# Patient Record
Sex: Female | Born: 1965 | Race: Black or African American | Hispanic: No | Marital: Married | Smoking: Current every day smoker
Health system: Southern US, Community
[De-identification: ages and names within clinical notes are randomized; demographics above are authoritative.]

## PROBLEM LIST (undated history)

## (undated) DIAGNOSIS — I10 Essential (primary) hypertension: Secondary | ICD-10-CM

## (undated) DIAGNOSIS — Z720 Tobacco use: Secondary | ICD-10-CM

## (undated) DIAGNOSIS — I5020 Unspecified systolic (congestive) heart failure: Secondary | ICD-10-CM

## (undated) DIAGNOSIS — I2510 Atherosclerotic heart disease of native coronary artery without angina pectoris: Secondary | ICD-10-CM

## (undated) DIAGNOSIS — R7303 Prediabetes: Secondary | ICD-10-CM

## (undated) HISTORY — DX: Tobacco use: Z72.0

## (undated) HISTORY — DX: Unspecified systolic (congestive) heart failure: I50.20

## (undated) HISTORY — DX: Prediabetes: R73.03

## (undated) HISTORY — PX: ABDOMINAL HYSTERECTOMY: SHX81

## (undated) HISTORY — DX: Essential (primary) hypertension: I10

## (undated) HISTORY — DX: Atherosclerotic heart disease of native coronary artery without angina pectoris: I25.10

---

## 1999-10-02 ENCOUNTER — Emergency Department (HOSPITAL_COMMUNITY): Admission: EM | Admit: 1999-10-02 | Discharge: 1999-10-02 | Payer: Self-pay | Admitting: Emergency Medicine

## 2008-06-04 ENCOUNTER — Ambulatory Visit: Payer: Self-pay | Admitting: Family Medicine

## 2009-09-02 ENCOUNTER — Ambulatory Visit: Payer: Self-pay | Admitting: Internal Medicine

## 2009-10-28 ENCOUNTER — Ambulatory Visit: Payer: Self-pay | Admitting: Internal Medicine

## 2010-06-10 ENCOUNTER — Ambulatory Visit: Payer: Self-pay | Admitting: Internal Medicine

## 2011-02-18 ENCOUNTER — Ambulatory Visit: Payer: Self-pay | Admitting: Family Medicine

## 2012-03-13 ENCOUNTER — Ambulatory Visit: Payer: Self-pay

## 2012-05-08 ENCOUNTER — Emergency Department: Payer: Self-pay | Admitting: Emergency Medicine

## 2013-02-02 ENCOUNTER — Ambulatory Visit: Payer: Self-pay
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
## 2015-02-16 ENCOUNTER — Ambulatory Visit: Payer: Self-pay
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
## 2018-07-09 DIAGNOSIS — M79603 Pain in arm, unspecified: Secondary | ICD-10-CM | POA: Diagnosis not present

## 2018-07-09 DIAGNOSIS — R079 Chest pain, unspecified: Secondary | ICD-10-CM | POA: Diagnosis not present

## 2019-06-08 DIAGNOSIS — U071 COVID-19: Secondary | ICD-10-CM | POA: Diagnosis not present

## 2019-06-09 DIAGNOSIS — U071 COVID-19: Secondary | ICD-10-CM | POA: Diagnosis not present

## 2019-06-18 DIAGNOSIS — U071 COVID-19: Secondary | ICD-10-CM | POA: Diagnosis not present

## 2019-07-01 DIAGNOSIS — U071 COVID-19: Secondary | ICD-10-CM | POA: Diagnosis not present

## 2019-07-10 DIAGNOSIS — U071 COVID-19: Secondary | ICD-10-CM | POA: Diagnosis not present

## 2019-07-17 DIAGNOSIS — U071 COVID-19: Secondary | ICD-10-CM | POA: Diagnosis not present

## 2019-07-23 DIAGNOSIS — U071 COVID-19: Secondary | ICD-10-CM | POA: Diagnosis not present

## 2019-07-30 DIAGNOSIS — U071 COVID-19: Secondary | ICD-10-CM | POA: Diagnosis not present

## 2019-08-06 DIAGNOSIS — U071 COVID-19: Secondary | ICD-10-CM | POA: Diagnosis not present

## 2019-08-13 DIAGNOSIS — U071 COVID-19: Secondary | ICD-10-CM | POA: Diagnosis not present

## 2019-08-27 DIAGNOSIS — U071 COVID-19: Secondary | ICD-10-CM | POA: Diagnosis not present

## 2019-09-03 DIAGNOSIS — U071 COVID-19: Secondary | ICD-10-CM | POA: Diagnosis not present

## 2019-09-11 DIAGNOSIS — U071 COVID-19: Secondary | ICD-10-CM | POA: Diagnosis not present

## 2019-09-17 DIAGNOSIS — U071 COVID-19: Secondary | ICD-10-CM | POA: Diagnosis not present

## 2019-09-22 DIAGNOSIS — Z6823 Body mass index (BMI) 23.0-23.9, adult: Secondary | ICD-10-CM | POA: Diagnosis not present

## 2019-09-22 DIAGNOSIS — I10 Essential (primary) hypertension: Secondary | ICD-10-CM | POA: Diagnosis not present

## 2019-09-22 DIAGNOSIS — Z7689 Persons encountering health services in other specified circumstances: Secondary | ICD-10-CM | POA: Diagnosis not present

## 2019-09-22 DIAGNOSIS — Z23 Encounter for immunization: Secondary | ICD-10-CM | POA: Diagnosis not present

## 2019-09-22 DIAGNOSIS — Z1331 Encounter for screening for depression: Secondary | ICD-10-CM | POA: Diagnosis not present

## 2019-09-22 DIAGNOSIS — E041 Nontoxic single thyroid nodule: Secondary | ICD-10-CM | POA: Diagnosis not present

## 2019-09-24 DIAGNOSIS — U071 COVID-19: Secondary | ICD-10-CM | POA: Diagnosis not present

## 2019-10-01 DIAGNOSIS — U071 COVID-19: Secondary | ICD-10-CM | POA: Diagnosis not present

## 2019-10-06 DIAGNOSIS — I10 Essential (primary) hypertension: Secondary | ICD-10-CM | POA: Diagnosis not present

## 2019-10-06 DIAGNOSIS — Z6823 Body mass index (BMI) 23.0-23.9, adult: Secondary | ICD-10-CM | POA: Diagnosis not present

## 2019-10-06 DIAGNOSIS — R7309 Other abnormal glucose: Secondary | ICD-10-CM | POA: Diagnosis not present

## 2019-10-06 DIAGNOSIS — L309 Dermatitis, unspecified: Secondary | ICD-10-CM | POA: Diagnosis not present

## 2019-10-07 DIAGNOSIS — U071 COVID-19: Secondary | ICD-10-CM | POA: Diagnosis not present

## 2019-10-14 DIAGNOSIS — U071 COVID-19: Secondary | ICD-10-CM | POA: Diagnosis not present

## 2019-10-21 DIAGNOSIS — U071 COVID-19: Secondary | ICD-10-CM | POA: Diagnosis not present

## 2020-02-22 DIAGNOSIS — U071 COVID-19: Secondary | ICD-10-CM | POA: Diagnosis not present

## 2020-02-25 DIAGNOSIS — U071 COVID-19: Secondary | ICD-10-CM | POA: Diagnosis not present

## 2020-02-25 DIAGNOSIS — Z20828 Contact with and (suspected) exposure to other viral communicable diseases: Secondary | ICD-10-CM | POA: Diagnosis not present

## 2020-02-29 DIAGNOSIS — U071 COVID-19: Secondary | ICD-10-CM | POA: Diagnosis not present

## 2020-02-29 DIAGNOSIS — Z20828 Contact with and (suspected) exposure to other viral communicable diseases: Secondary | ICD-10-CM | POA: Diagnosis not present

## 2020-03-03 DIAGNOSIS — Z20828 Contact with and (suspected) exposure to other viral communicable diseases: Secondary | ICD-10-CM | POA: Diagnosis not present

## 2020-03-03 DIAGNOSIS — U071 COVID-19: Secondary | ICD-10-CM | POA: Diagnosis not present

## 2020-03-07 DIAGNOSIS — Z20828 Contact with and (suspected) exposure to other viral communicable diseases: Secondary | ICD-10-CM | POA: Diagnosis not present

## 2020-03-07 DIAGNOSIS — U071 COVID-19: Secondary | ICD-10-CM | POA: Diagnosis not present

## 2020-03-10 DIAGNOSIS — Z20828 Contact with and (suspected) exposure to other viral communicable diseases: Secondary | ICD-10-CM | POA: Diagnosis not present

## 2020-03-10 DIAGNOSIS — U071 COVID-19: Secondary | ICD-10-CM | POA: Diagnosis not present

## 2020-03-14 DIAGNOSIS — U071 COVID-19: Secondary | ICD-10-CM | POA: Diagnosis not present

## 2020-03-14 DIAGNOSIS — Z20828 Contact with and (suspected) exposure to other viral communicable diseases: Secondary | ICD-10-CM | POA: Diagnosis not present

## 2020-03-17 DIAGNOSIS — U071 COVID-19: Secondary | ICD-10-CM | POA: Diagnosis not present

## 2020-03-21 DIAGNOSIS — Z20828 Contact with and (suspected) exposure to other viral communicable diseases: Secondary | ICD-10-CM | POA: Diagnosis not present

## 2020-03-21 DIAGNOSIS — U071 COVID-19: Secondary | ICD-10-CM | POA: Diagnosis not present

## 2020-03-24 DIAGNOSIS — U071 COVID-19: Secondary | ICD-10-CM | POA: Diagnosis not present

## 2020-03-24 DIAGNOSIS — Z20828 Contact with and (suspected) exposure to other viral communicable diseases: Secondary | ICD-10-CM | POA: Diagnosis not present

## 2020-03-28 DIAGNOSIS — Z20828 Contact with and (suspected) exposure to other viral communicable diseases: Secondary | ICD-10-CM | POA: Diagnosis not present

## 2020-03-28 DIAGNOSIS — U071 COVID-19: Secondary | ICD-10-CM | POA: Diagnosis not present

## 2020-03-31 DIAGNOSIS — U071 COVID-19: Secondary | ICD-10-CM | POA: Diagnosis not present

## 2020-03-31 DIAGNOSIS — Z20828 Contact with and (suspected) exposure to other viral communicable diseases: Secondary | ICD-10-CM | POA: Diagnosis not present

## 2020-04-04 DIAGNOSIS — Z20828 Contact with and (suspected) exposure to other viral communicable diseases: Secondary | ICD-10-CM | POA: Diagnosis not present

## 2020-04-04 DIAGNOSIS — U071 COVID-19: Secondary | ICD-10-CM | POA: Diagnosis not present

## 2020-04-07 DIAGNOSIS — U071 COVID-19: Secondary | ICD-10-CM | POA: Diagnosis not present

## 2020-04-07 DIAGNOSIS — Z20828 Contact with and (suspected) exposure to other viral communicable diseases: Secondary | ICD-10-CM | POA: Diagnosis not present

## 2020-04-11 DIAGNOSIS — Z20828 Contact with and (suspected) exposure to other viral communicable diseases: Secondary | ICD-10-CM | POA: Diagnosis not present

## 2020-04-11 DIAGNOSIS — U071 COVID-19: Secondary | ICD-10-CM | POA: Diagnosis not present

## 2020-04-18 DIAGNOSIS — U071 COVID-19: Secondary | ICD-10-CM | POA: Diagnosis not present

## 2020-04-18 DIAGNOSIS — Z20828 Contact with and (suspected) exposure to other viral communicable diseases: Secondary | ICD-10-CM | POA: Diagnosis not present

## 2020-04-21 DIAGNOSIS — U071 COVID-19: Secondary | ICD-10-CM | POA: Diagnosis not present

## 2020-04-21 DIAGNOSIS — Z20828 Contact with and (suspected) exposure to other viral communicable diseases: Secondary | ICD-10-CM | POA: Diagnosis not present

## 2020-04-25 DIAGNOSIS — F17210 Nicotine dependence, cigarettes, uncomplicated: Secondary | ICD-10-CM | POA: Diagnosis not present

## 2020-04-25 DIAGNOSIS — R4701 Aphasia: Secondary | ICD-10-CM | POA: Diagnosis not present

## 2020-04-25 DIAGNOSIS — E782 Mixed hyperlipidemia: Secondary | ICD-10-CM | POA: Diagnosis not present

## 2020-04-25 DIAGNOSIS — G459 Transient cerebral ischemic attack, unspecified: Secondary | ICD-10-CM | POA: Diagnosis not present

## 2020-04-25 DIAGNOSIS — R471 Dysarthria and anarthria: Secondary | ICD-10-CM | POA: Diagnosis not present

## 2020-04-25 DIAGNOSIS — R4781 Slurred speech: Secondary | ICD-10-CM | POA: Diagnosis not present

## 2020-04-25 DIAGNOSIS — R531 Weakness: Secondary | ICD-10-CM | POA: Diagnosis not present

## 2020-04-25 DIAGNOSIS — G4489 Other headache syndrome: Secondary | ICD-10-CM | POA: Diagnosis not present

## 2020-04-25 DIAGNOSIS — R7309 Other abnormal glucose: Secondary | ICD-10-CM | POA: Diagnosis not present

## 2020-04-28 DIAGNOSIS — F411 Generalized anxiety disorder: Secondary | ICD-10-CM | POA: Diagnosis not present

## 2020-04-28 DIAGNOSIS — Z20828 Contact with and (suspected) exposure to other viral communicable diseases: Secondary | ICD-10-CM | POA: Diagnosis not present

## 2020-04-28 DIAGNOSIS — I10 Essential (primary) hypertension: Secondary | ICD-10-CM | POA: Diagnosis not present

## 2020-04-28 DIAGNOSIS — U071 COVID-19: Secondary | ICD-10-CM | POA: Diagnosis not present

## 2020-05-02 DIAGNOSIS — U071 COVID-19: Secondary | ICD-10-CM | POA: Diagnosis not present

## 2020-05-03 DIAGNOSIS — U071 COVID-19: Secondary | ICD-10-CM | POA: Diagnosis not present

## 2020-05-03 DIAGNOSIS — Z20828 Contact with and (suspected) exposure to other viral communicable diseases: Secondary | ICD-10-CM | POA: Diagnosis not present

## 2020-05-05 DIAGNOSIS — Z20828 Contact with and (suspected) exposure to other viral communicable diseases: Secondary | ICD-10-CM | POA: Diagnosis not present

## 2020-05-05 DIAGNOSIS — U071 COVID-19: Secondary | ICD-10-CM | POA: Diagnosis not present

## 2020-05-06 DIAGNOSIS — L239 Allergic contact dermatitis, unspecified cause: Secondary | ICD-10-CM | POA: Diagnosis not present

## 2020-05-06 DIAGNOSIS — F411 Generalized anxiety disorder: Secondary | ICD-10-CM | POA: Diagnosis not present

## 2020-05-09 DIAGNOSIS — U071 COVID-19: Secondary | ICD-10-CM | POA: Diagnosis not present

## 2020-05-10 DIAGNOSIS — U071 COVID-19: Secondary | ICD-10-CM | POA: Diagnosis not present

## 2020-05-10 DIAGNOSIS — Z20828 Contact with and (suspected) exposure to other viral communicable diseases: Secondary | ICD-10-CM | POA: Diagnosis not present

## 2020-05-24 DIAGNOSIS — Z20828 Contact with and (suspected) exposure to other viral communicable diseases: Secondary | ICD-10-CM | POA: Diagnosis not present

## 2020-05-24 DIAGNOSIS — U071 COVID-19: Secondary | ICD-10-CM | POA: Diagnosis not present

## 2020-06-01 DIAGNOSIS — U071 COVID-19: Secondary | ICD-10-CM | POA: Diagnosis not present

## 2020-06-01 DIAGNOSIS — Z20828 Contact with and (suspected) exposure to other viral communicable diseases: Secondary | ICD-10-CM | POA: Diagnosis not present

## 2020-06-06 DIAGNOSIS — U071 COVID-19: Secondary | ICD-10-CM | POA: Diagnosis not present

## 2020-06-06 DIAGNOSIS — Z20828 Contact with and (suspected) exposure to other viral communicable diseases: Secondary | ICD-10-CM | POA: Diagnosis not present

## 2020-06-13 DIAGNOSIS — Z20828 Contact with and (suspected) exposure to other viral communicable diseases: Secondary | ICD-10-CM | POA: Diagnosis not present

## 2020-06-13 DIAGNOSIS — U071 COVID-19: Secondary | ICD-10-CM | POA: Diagnosis not present

## 2020-06-22 DIAGNOSIS — U071 COVID-19: Secondary | ICD-10-CM | POA: Diagnosis not present

## 2020-06-29 DIAGNOSIS — Z20828 Contact with and (suspected) exposure to other viral communicable diseases: Secondary | ICD-10-CM | POA: Diagnosis not present

## 2020-06-29 DIAGNOSIS — U071 COVID-19: Secondary | ICD-10-CM | POA: Diagnosis not present

## 2020-07-04 DIAGNOSIS — U071 COVID-19: Secondary | ICD-10-CM | POA: Diagnosis not present

## 2020-07-04 DIAGNOSIS — Z20828 Contact with and (suspected) exposure to other viral communicable diseases: Secondary | ICD-10-CM | POA: Diagnosis not present

## 2020-07-27 DIAGNOSIS — U071 COVID-19: Secondary | ICD-10-CM | POA: Diagnosis not present

## 2020-07-27 DIAGNOSIS — Z20828 Contact with and (suspected) exposure to other viral communicable diseases: Secondary | ICD-10-CM | POA: Diagnosis not present

## 2020-08-02 DIAGNOSIS — U071 COVID-19: Secondary | ICD-10-CM | POA: Diagnosis not present

## 2020-08-02 DIAGNOSIS — Z20828 Contact with and (suspected) exposure to other viral communicable diseases: Secondary | ICD-10-CM | POA: Diagnosis not present

## 2020-08-08 DIAGNOSIS — U071 COVID-19: Secondary | ICD-10-CM | POA: Diagnosis not present

## 2020-08-08 DIAGNOSIS — Z20828 Contact with and (suspected) exposure to other viral communicable diseases: Secondary | ICD-10-CM | POA: Diagnosis not present

## 2020-08-16 DIAGNOSIS — U071 COVID-19: Secondary | ICD-10-CM | POA: Diagnosis not present

## 2020-08-16 DIAGNOSIS — Z20828 Contact with and (suspected) exposure to other viral communicable diseases: Secondary | ICD-10-CM | POA: Diagnosis not present

## 2020-08-22 DIAGNOSIS — U071 COVID-19: Secondary | ICD-10-CM | POA: Diagnosis not present

## 2020-08-22 DIAGNOSIS — Z20828 Contact with and (suspected) exposure to other viral communicable diseases: Secondary | ICD-10-CM | POA: Diagnosis not present

## 2020-08-30 DIAGNOSIS — Z20828 Contact with and (suspected) exposure to other viral communicable diseases: Secondary | ICD-10-CM | POA: Diagnosis not present

## 2020-08-30 DIAGNOSIS — U071 COVID-19: Secondary | ICD-10-CM | POA: Diagnosis not present

## 2020-09-05 DIAGNOSIS — Z20828 Contact with and (suspected) exposure to other viral communicable diseases: Secondary | ICD-10-CM | POA: Diagnosis not present

## 2020-09-05 DIAGNOSIS — U071 COVID-19: Secondary | ICD-10-CM | POA: Diagnosis not present

## 2020-09-08 DIAGNOSIS — U071 COVID-19: Secondary | ICD-10-CM | POA: Diagnosis not present

## 2020-09-08 DIAGNOSIS — Z20828 Contact with and (suspected) exposure to other viral communicable diseases: Secondary | ICD-10-CM | POA: Diagnosis not present

## 2020-09-13 DIAGNOSIS — Z20828 Contact with and (suspected) exposure to other viral communicable diseases: Secondary | ICD-10-CM | POA: Diagnosis not present

## 2020-09-13 DIAGNOSIS — U071 COVID-19: Secondary | ICD-10-CM | POA: Diagnosis not present

## 2020-09-15 DIAGNOSIS — U071 COVID-19: Secondary | ICD-10-CM | POA: Diagnosis not present

## 2020-09-15 DIAGNOSIS — Z20828 Contact with and (suspected) exposure to other viral communicable diseases: Secondary | ICD-10-CM | POA: Diagnosis not present

## 2020-09-20 DIAGNOSIS — Z20828 Contact with and (suspected) exposure to other viral communicable diseases: Secondary | ICD-10-CM | POA: Diagnosis not present

## 2020-09-20 DIAGNOSIS — U071 COVID-19: Secondary | ICD-10-CM | POA: Diagnosis not present

## 2020-09-22 DIAGNOSIS — Z20828 Contact with and (suspected) exposure to other viral communicable diseases: Secondary | ICD-10-CM | POA: Diagnosis not present

## 2020-09-22 DIAGNOSIS — U071 COVID-19: Secondary | ICD-10-CM | POA: Diagnosis not present

## 2020-09-27 DIAGNOSIS — Z20828 Contact with and (suspected) exposure to other viral communicable diseases: Secondary | ICD-10-CM | POA: Diagnosis not present

## 2020-09-27 DIAGNOSIS — U071 COVID-19: Secondary | ICD-10-CM | POA: Diagnosis not present

## 2020-09-29 DIAGNOSIS — U071 COVID-19: Secondary | ICD-10-CM | POA: Diagnosis not present

## 2020-09-29 DIAGNOSIS — Z20828 Contact with and (suspected) exposure to other viral communicable diseases: Secondary | ICD-10-CM | POA: Diagnosis not present

## 2020-10-06 DIAGNOSIS — U071 COVID-19: Secondary | ICD-10-CM | POA: Diagnosis not present

## 2020-10-12 DIAGNOSIS — U071 COVID-19: Secondary | ICD-10-CM | POA: Diagnosis not present

## 2020-10-12 DIAGNOSIS — Z20828 Contact with and (suspected) exposure to other viral communicable diseases: Secondary | ICD-10-CM | POA: Diagnosis not present

## 2020-10-13 DIAGNOSIS — U071 COVID-19: Secondary | ICD-10-CM | POA: Diagnosis not present

## 2020-10-13 DIAGNOSIS — Z20828 Contact with and (suspected) exposure to other viral communicable diseases: Secondary | ICD-10-CM | POA: Diagnosis not present

## 2020-10-20 DIAGNOSIS — U071 COVID-19: Secondary | ICD-10-CM | POA: Diagnosis not present

## 2020-10-20 DIAGNOSIS — Z20828 Contact with and (suspected) exposure to other viral communicable diseases: Secondary | ICD-10-CM | POA: Diagnosis not present

## 2020-10-24 DIAGNOSIS — U071 COVID-19: Secondary | ICD-10-CM | POA: Diagnosis not present

## 2020-10-24 DIAGNOSIS — Z20828 Contact with and (suspected) exposure to other viral communicable diseases: Secondary | ICD-10-CM | POA: Diagnosis not present

## 2020-10-27 DIAGNOSIS — U071 COVID-19: Secondary | ICD-10-CM | POA: Diagnosis not present

## 2020-10-27 DIAGNOSIS — Z20828 Contact with and (suspected) exposure to other viral communicable diseases: Secondary | ICD-10-CM | POA: Diagnosis not present

## 2020-10-31 DIAGNOSIS — Z20828 Contact with and (suspected) exposure to other viral communicable diseases: Secondary | ICD-10-CM | POA: Diagnosis not present

## 2020-10-31 DIAGNOSIS — U071 COVID-19: Secondary | ICD-10-CM | POA: Diagnosis not present

## 2020-11-03 DIAGNOSIS — Z20828 Contact with and (suspected) exposure to other viral communicable diseases: Secondary | ICD-10-CM | POA: Diagnosis not present

## 2020-11-03 DIAGNOSIS — U071 COVID-19: Secondary | ICD-10-CM | POA: Diagnosis not present

## 2020-11-07 DIAGNOSIS — U071 COVID-19: Secondary | ICD-10-CM | POA: Diagnosis not present

## 2020-11-07 DIAGNOSIS — Z20828 Contact with and (suspected) exposure to other viral communicable diseases: Secondary | ICD-10-CM | POA: Diagnosis not present

## 2020-11-10 DIAGNOSIS — Z20828 Contact with and (suspected) exposure to other viral communicable diseases: Secondary | ICD-10-CM | POA: Diagnosis not present

## 2020-11-10 DIAGNOSIS — U071 COVID-19: Secondary | ICD-10-CM | POA: Diagnosis not present

## 2020-11-14 DIAGNOSIS — U071 COVID-19: Secondary | ICD-10-CM | POA: Diagnosis not present

## 2020-11-14 DIAGNOSIS — Z20828 Contact with and (suspected) exposure to other viral communicable diseases: Secondary | ICD-10-CM | POA: Diagnosis not present

## 2020-11-21 DIAGNOSIS — U071 COVID-19: Secondary | ICD-10-CM | POA: Diagnosis not present

## 2020-11-21 DIAGNOSIS — Z20828 Contact with and (suspected) exposure to other viral communicable diseases: Secondary | ICD-10-CM | POA: Diagnosis not present

## 2020-11-24 DIAGNOSIS — Z20828 Contact with and (suspected) exposure to other viral communicable diseases: Secondary | ICD-10-CM | POA: Diagnosis not present

## 2020-11-24 DIAGNOSIS — U071 COVID-19: Secondary | ICD-10-CM | POA: Diagnosis not present

## 2020-12-01 DIAGNOSIS — Z20828 Contact with and (suspected) exposure to other viral communicable diseases: Secondary | ICD-10-CM | POA: Diagnosis not present

## 2020-12-01 DIAGNOSIS — U071 COVID-19: Secondary | ICD-10-CM | POA: Diagnosis not present

## 2020-12-05 DIAGNOSIS — Z20828 Contact with and (suspected) exposure to other viral communicable diseases: Secondary | ICD-10-CM | POA: Diagnosis not present

## 2020-12-05 DIAGNOSIS — U071 COVID-19: Secondary | ICD-10-CM | POA: Diagnosis not present

## 2020-12-08 DIAGNOSIS — Z20828 Contact with and (suspected) exposure to other viral communicable diseases: Secondary | ICD-10-CM | POA: Diagnosis not present

## 2020-12-08 DIAGNOSIS — U071 COVID-19: Secondary | ICD-10-CM | POA: Diagnosis not present

## 2020-12-12 DIAGNOSIS — Z20828 Contact with and (suspected) exposure to other viral communicable diseases: Secondary | ICD-10-CM | POA: Diagnosis not present

## 2020-12-12 DIAGNOSIS — U071 COVID-19: Secondary | ICD-10-CM | POA: Diagnosis not present

## 2020-12-13 DIAGNOSIS — Z20828 Contact with and (suspected) exposure to other viral communicable diseases: Secondary | ICD-10-CM | POA: Diagnosis not present

## 2020-12-13 DIAGNOSIS — U071 COVID-19: Secondary | ICD-10-CM | POA: Diagnosis not present

## 2020-12-20 DIAGNOSIS — U071 COVID-19: Secondary | ICD-10-CM | POA: Diagnosis not present

## 2020-12-20 DIAGNOSIS — Z20828 Contact with and (suspected) exposure to other viral communicable diseases: Secondary | ICD-10-CM | POA: Diagnosis not present

## 2020-12-22 DIAGNOSIS — Z20828 Contact with and (suspected) exposure to other viral communicable diseases: Secondary | ICD-10-CM | POA: Diagnosis not present

## 2020-12-22 DIAGNOSIS — U071 COVID-19: Secondary | ICD-10-CM | POA: Diagnosis not present

## 2020-12-26 DIAGNOSIS — U071 COVID-19: Secondary | ICD-10-CM | POA: Diagnosis not present

## 2020-12-27 DIAGNOSIS — Z20828 Contact with and (suspected) exposure to other viral communicable diseases: Secondary | ICD-10-CM | POA: Diagnosis not present

## 2020-12-27 DIAGNOSIS — U071 COVID-19: Secondary | ICD-10-CM | POA: Diagnosis not present

## 2020-12-29 DIAGNOSIS — U071 COVID-19: Secondary | ICD-10-CM | POA: Diagnosis not present

## 2020-12-29 DIAGNOSIS — Z20828 Contact with and (suspected) exposure to other viral communicable diseases: Secondary | ICD-10-CM | POA: Diagnosis not present

## 2021-01-02 DIAGNOSIS — U071 COVID-19: Secondary | ICD-10-CM | POA: Diagnosis not present

## 2021-01-02 DIAGNOSIS — Z20828 Contact with and (suspected) exposure to other viral communicable diseases: Secondary | ICD-10-CM | POA: Diagnosis not present

## 2021-01-05 DIAGNOSIS — U071 COVID-19: Secondary | ICD-10-CM | POA: Diagnosis not present

## 2021-01-05 DIAGNOSIS — Z20828 Contact with and (suspected) exposure to other viral communicable diseases: Secondary | ICD-10-CM | POA: Diagnosis not present

## 2021-01-10 DIAGNOSIS — U071 COVID-19: Secondary | ICD-10-CM | POA: Diagnosis not present

## 2021-01-12 DIAGNOSIS — Z20828 Contact with and (suspected) exposure to other viral communicable diseases: Secondary | ICD-10-CM | POA: Diagnosis not present

## 2021-01-12 DIAGNOSIS — U071 COVID-19: Secondary | ICD-10-CM | POA: Diagnosis not present

## 2021-01-16 DIAGNOSIS — U071 COVID-19: Secondary | ICD-10-CM | POA: Diagnosis not present

## 2021-01-16 DIAGNOSIS — Z20828 Contact with and (suspected) exposure to other viral communicable diseases: Secondary | ICD-10-CM | POA: Diagnosis not present

## 2021-01-19 DIAGNOSIS — Z20828 Contact with and (suspected) exposure to other viral communicable diseases: Secondary | ICD-10-CM | POA: Diagnosis not present

## 2021-01-19 DIAGNOSIS — U071 COVID-19: Secondary | ICD-10-CM | POA: Diagnosis not present

## 2021-01-23 DIAGNOSIS — Z20828 Contact with and (suspected) exposure to other viral communicable diseases: Secondary | ICD-10-CM | POA: Diagnosis not present

## 2021-01-23 DIAGNOSIS — U071 COVID-19: Secondary | ICD-10-CM | POA: Diagnosis not present

## 2021-01-26 DIAGNOSIS — U071 COVID-19: Secondary | ICD-10-CM | POA: Diagnosis not present

## 2021-01-30 DIAGNOSIS — U071 COVID-19: Secondary | ICD-10-CM | POA: Diagnosis not present

## 2021-02-02 DIAGNOSIS — U071 COVID-19: Secondary | ICD-10-CM | POA: Diagnosis not present

## 2021-02-06 DIAGNOSIS — Z20828 Contact with and (suspected) exposure to other viral communicable diseases: Secondary | ICD-10-CM | POA: Diagnosis not present

## 2021-02-06 DIAGNOSIS — U071 COVID-19: Secondary | ICD-10-CM | POA: Diagnosis not present

## 2021-02-07 DIAGNOSIS — U071 COVID-19: Secondary | ICD-10-CM | POA: Diagnosis not present

## 2021-02-07 DIAGNOSIS — Z20828 Contact with and (suspected) exposure to other viral communicable diseases: Secondary | ICD-10-CM | POA: Diagnosis not present

## 2021-02-09 DIAGNOSIS — Z20828 Contact with and (suspected) exposure to other viral communicable diseases: Secondary | ICD-10-CM | POA: Diagnosis not present

## 2021-02-09 DIAGNOSIS — U071 COVID-19: Secondary | ICD-10-CM | POA: Diagnosis not present

## 2021-02-13 DIAGNOSIS — U071 COVID-19: Secondary | ICD-10-CM | POA: Diagnosis not present

## 2021-02-13 DIAGNOSIS — Z20828 Contact with and (suspected) exposure to other viral communicable diseases: Secondary | ICD-10-CM | POA: Diagnosis not present

## 2021-02-16 DIAGNOSIS — U071 COVID-19: Secondary | ICD-10-CM | POA: Diagnosis not present

## 2021-02-16 DIAGNOSIS — Z20828 Contact with and (suspected) exposure to other viral communicable diseases: Secondary | ICD-10-CM | POA: Diagnosis not present

## 2021-02-20 DIAGNOSIS — U071 COVID-19: Secondary | ICD-10-CM | POA: Diagnosis not present

## 2021-02-20 DIAGNOSIS — Z20828 Contact with and (suspected) exposure to other viral communicable diseases: Secondary | ICD-10-CM | POA: Diagnosis not present

## 2021-02-23 DIAGNOSIS — Z20828 Contact with and (suspected) exposure to other viral communicable diseases: Secondary | ICD-10-CM | POA: Diagnosis not present

## 2021-02-23 DIAGNOSIS — U071 COVID-19: Secondary | ICD-10-CM | POA: Diagnosis not present

## 2021-02-27 DIAGNOSIS — Z20828 Contact with and (suspected) exposure to other viral communicable diseases: Secondary | ICD-10-CM | POA: Diagnosis not present

## 2021-02-27 DIAGNOSIS — U071 COVID-19: Secondary | ICD-10-CM | POA: Diagnosis not present

## 2021-03-02 DIAGNOSIS — Z20828 Contact with and (suspected) exposure to other viral communicable diseases: Secondary | ICD-10-CM | POA: Diagnosis not present

## 2021-03-02 DIAGNOSIS — U071 COVID-19: Secondary | ICD-10-CM | POA: Diagnosis not present

## 2021-03-09 DIAGNOSIS — Z20828 Contact with and (suspected) exposure to other viral communicable diseases: Secondary | ICD-10-CM | POA: Diagnosis not present

## 2021-03-09 DIAGNOSIS — U071 COVID-19: Secondary | ICD-10-CM | POA: Diagnosis not present

## 2021-03-13 DIAGNOSIS — Z20828 Contact with and (suspected) exposure to other viral communicable diseases: Secondary | ICD-10-CM | POA: Diagnosis not present

## 2021-03-13 DIAGNOSIS — U071 COVID-19: Secondary | ICD-10-CM | POA: Diagnosis not present

## 2021-04-17 DIAGNOSIS — R7309 Other abnormal glucose: Secondary | ICD-10-CM | POA: Diagnosis not present

## 2021-04-17 DIAGNOSIS — L309 Dermatitis, unspecified: Secondary | ICD-10-CM | POA: Diagnosis not present

## 2021-04-17 DIAGNOSIS — E782 Mixed hyperlipidemia: Secondary | ICD-10-CM | POA: Diagnosis not present

## 2021-04-17 DIAGNOSIS — I10 Essential (primary) hypertension: Secondary | ICD-10-CM | POA: Diagnosis not present

## 2021-05-07 DIAGNOSIS — S161XXA Strain of muscle, fascia and tendon at neck level, initial encounter: Secondary | ICD-10-CM | POA: Diagnosis not present

## 2021-05-07 DIAGNOSIS — M25512 Pain in left shoulder: Secondary | ICD-10-CM | POA: Diagnosis not present

## 2021-05-30 DIAGNOSIS — Z6822 Body mass index (BMI) 22.0-22.9, adult: Secondary | ICD-10-CM | POA: Diagnosis not present

## 2021-05-30 DIAGNOSIS — R252 Cramp and spasm: Secondary | ICD-10-CM | POA: Diagnosis not present

## 2021-08-25 DIAGNOSIS — J069 Acute upper respiratory infection, unspecified: Secondary | ICD-10-CM | POA: Diagnosis not present

## 2023-07-05 ENCOUNTER — Inpatient Hospital Stay (HOSPITAL_COMMUNITY)
Admission: RE | Admit: 2023-07-05 | Discharge: 2023-07-13 | DRG: 233 | Disposition: A | Discharge status: Home / Self Care | Payer: Managed Care, Other (non HMO) | Attending: Thoracic Surgery (Cardiothoracic Vascular Surgery) | Admitting: Thoracic Surgery (Cardiothoracic Vascular Surgery)

## 2023-07-05 ENCOUNTER — Encounter (HOSPITAL_COMMUNITY)
Admission: RE | Disposition: A | Discharge status: Home / Self Care | Payer: Self-pay | Source: Home / Self Care | Attending: Thoracic Surgery (Cardiothoracic Vascular Surgery)

## 2023-07-05 DIAGNOSIS — E8729 Other acidosis: Secondary | ICD-10-CM | POA: Diagnosis not present

## 2023-07-05 DIAGNOSIS — M79602 Pain in left arm: Secondary | ICD-10-CM

## 2023-07-05 DIAGNOSIS — Z1152 Encounter for screening for COVID-19: Secondary | ICD-10-CM | POA: Diagnosis not present

## 2023-07-05 DIAGNOSIS — I10 Essential (primary) hypertension: Secondary | ICD-10-CM

## 2023-07-05 DIAGNOSIS — I2510 Atherosclerotic heart disease of native coronary artery without angina pectoris: Secondary | ICD-10-CM | POA: Diagnosis present

## 2023-07-05 DIAGNOSIS — I249 Acute ischemic heart disease, unspecified: Secondary | ICD-10-CM

## 2023-07-05 DIAGNOSIS — F17210 Nicotine dependence, cigarettes, uncomplicated: Secondary | ICD-10-CM | POA: Diagnosis present

## 2023-07-05 DIAGNOSIS — I214 Non-ST elevation (NSTEMI) myocardial infarction: Secondary | ICD-10-CM

## 2023-07-05 DIAGNOSIS — I517 Cardiomegaly: Secondary | ICD-10-CM

## 2023-07-05 DIAGNOSIS — Z951 Presence of aortocoronary bypass graft: Secondary | ICD-10-CM

## 2023-07-05 DIAGNOSIS — I110 Hypertensive heart disease with heart failure: Secondary | ICD-10-CM | POA: Diagnosis present

## 2023-07-05 DIAGNOSIS — F40240 Claustrophobia: Secondary | ICD-10-CM | POA: Diagnosis present

## 2023-07-05 DIAGNOSIS — Z720 Tobacco use: Secondary | ICD-10-CM

## 2023-07-05 DIAGNOSIS — D696 Thrombocytopenia, unspecified: Secondary | ICD-10-CM | POA: Diagnosis not present

## 2023-07-05 DIAGNOSIS — I5021 Acute systolic (congestive) heart failure: Secondary | ICD-10-CM | POA: Diagnosis present

## 2023-07-05 DIAGNOSIS — E785 Hyperlipidemia, unspecified: Secondary | ICD-10-CM | POA: Diagnosis present

## 2023-07-05 DIAGNOSIS — Z01810 Encounter for preprocedural cardiovascular examination: Secondary | ICD-10-CM | POA: Diagnosis not present

## 2023-07-05 DIAGNOSIS — R7303 Prediabetes: Secondary | ICD-10-CM | POA: Diagnosis present

## 2023-07-05 DIAGNOSIS — I361 Nonrheumatic tricuspid (valve) insufficiency: Secondary | ICD-10-CM

## 2023-07-05 DIAGNOSIS — I25110 Atherosclerotic heart disease of native coronary artery with unstable angina pectoris: Secondary | ICD-10-CM | POA: Diagnosis not present

## 2023-07-05 DIAGNOSIS — R9431 Abnormal electrocardiogram [ECG] [EKG]: Secondary | ICD-10-CM

## 2023-07-05 DIAGNOSIS — I252 Old myocardial infarction: Secondary | ICD-10-CM | POA: Diagnosis not present

## 2023-07-05 HISTORY — DX: Non-ST elevation (NSTEMI) myocardial infarction: I21.4

## 2023-07-05 HISTORY — PX: LEFT HEART CATH AND CORONARY ANGIOGRAPHY: CATH118249

## 2023-07-05 SURGERY — LEFT HEART CATH AND CORONARY ANGIOGRAPHY
Anesthesia: LOCAL

## 2023-07-05 MED ORDER — HEPARIN SODIUM (PORCINE) 1000 UNIT/ML IJ SOLN
INTRAMUSCULAR | Status: DC | PRN
  Administered 2023-07-05: 3000 [IU] via INTRAVENOUS

## 2023-07-05 MED ORDER — LIDOCAINE HCL (PF) 1 % IJ SOLN
INTRAMUSCULAR | Status: AC
  Filled 2023-07-05: qty 30

## 2023-07-05 MED ORDER — VERAPAMIL HCL 2.5 MG/ML IV SOLN
INTRAVENOUS | Status: DC | PRN
  Administered 2023-07-05: 10 mL via INTRA_ARTERIAL

## 2023-07-05 MED ORDER — HEPARIN (PORCINE) IN NACL 1000-0.9 UT/500ML-% IV SOLN
INTRAVENOUS | Status: DC | PRN
  Administered 2023-07-05 (×2): 500 mL

## 2023-07-05 MED ORDER — FENTANYL CITRATE (PF) 100 MCG/2ML IJ SOLN
INTRAMUSCULAR | Status: DC | PRN
  Administered 2023-07-05: 25 ug via INTRAVENOUS

## 2023-07-05 MED ORDER — MIDAZOLAM HCL 2 MG/2ML IJ SOLN
INTRAMUSCULAR | Status: AC
  Filled 2023-07-05: qty 2

## 2023-07-05 MED ORDER — CARVEDILOL 3.125 MG PO TABS: 3.1250 mg | ORAL_TABLET | Freq: Two times a day (BID) | ORAL | Status: DC

## 2023-07-05 MED ORDER — HEPARIN SODIUM (PORCINE) 1000 UNIT/ML IJ SOLN
INTRAMUSCULAR | Status: AC
  Filled 2023-07-05: qty 10

## 2023-07-05 MED ORDER — ACETAMINOPHEN 325 MG PO TABS
650.0000 mg | ORAL_TABLET | ORAL | Status: DC | PRN
  Administered 2023-07-05 – 2023-07-07 (×4): 650 mg via ORAL
  Filled 2023-07-05 (×4): qty 2

## 2023-07-05 MED ORDER — ISOSORBIDE MONONITRATE ER 30 MG PO TB24
30.0000 mg | ORAL_TABLET | Freq: Every day | ORAL | Status: DC
  Administered 2023-07-05 – 2023-07-08 (×4): 30 mg via ORAL
  Filled 2023-07-05 (×4): qty 1

## 2023-07-05 MED ORDER — CARVEDILOL 6.25 MG PO TABS
6.2500 mg | ORAL_TABLET | Freq: Two times a day (BID) | ORAL | Status: DC
  Administered 2023-07-05 – 2023-07-08 (×7): 6.25 mg via ORAL
  Filled 2023-07-05 (×7): qty 1

## 2023-07-05 MED ORDER — FENTANYL CITRATE (PF) 100 MCG/2ML IJ SOLN
INTRAMUSCULAR | Status: AC
  Filled 2023-07-05: qty 2

## 2023-07-05 MED ORDER — ASPIRIN 81 MG PO TBEC
81.0000 mg | DELAYED_RELEASE_TABLET | Freq: Every day | ORAL | Status: DC
  Administered 2023-07-06 – 2023-07-08 (×3): 81 mg via ORAL
  Filled 2023-07-05 (×3): qty 1

## 2023-07-05 MED ORDER — SODIUM CHLORIDE 0.9% FLUSH: 3.0000 mL | INTRAVENOUS | Status: DC | PRN

## 2023-07-05 MED ORDER — LIDOCAINE HCL (PF) 1 % IJ SOLN
INTRAMUSCULAR | Status: DC | PRN
  Administered 2023-07-05: 2 mL

## 2023-07-05 MED ORDER — SODIUM CHLORIDE 0.9 % IV SOLN: 250.0000 mL | INTRAVENOUS | Status: DC | PRN

## 2023-07-05 MED ORDER — HEPARIN (PORCINE) 25000 UT/250ML-% IV SOLN
1050.0000 [IU]/h | INTRAVENOUS | Status: DC
  Administered 2023-07-05: 750 [IU]/h via INTRAVENOUS
  Administered 2023-07-07 – 2023-07-09 (×2): 1050 [IU]/h via INTRAVENOUS
  Filled 2023-07-05 (×4): qty 250

## 2023-07-05 MED ORDER — IOHEXOL 350 MG/ML SOLN
INTRAVENOUS | Status: DC | PRN
  Administered 2023-07-05: 50 mL

## 2023-07-05 MED ORDER — ONDANSETRON HCL 4 MG/2ML IJ SOLN: 4.0000 mg | Freq: Four times a day (QID) | INTRAMUSCULAR | Status: DC | PRN

## 2023-07-05 MED ORDER — VERAPAMIL HCL 2.5 MG/ML IV SOLN
INTRAVENOUS | Status: AC
  Filled 2023-07-05: qty 2

## 2023-07-05 MED ORDER — ROSUVASTATIN CALCIUM 20 MG PO TABS
20.0000 mg | ORAL_TABLET | Freq: Every day | ORAL | Status: DC
  Administered 2023-07-05 – 2023-07-08 (×4): 20 mg via ORAL
  Filled 2023-07-05 (×5): qty 1

## 2023-07-05 MED ORDER — NITROGLYCERIN 0.4 MG SL SUBL: 0.4000 mg | SUBLINGUAL_TABLET | SUBLINGUAL | Status: DC | PRN

## 2023-07-05 MED ORDER — HYDRALAZINE HCL 20 MG/ML IJ SOLN: 10.0000 mg | INTRAMUSCULAR | Status: AC | PRN

## 2023-07-05 MED ORDER — SODIUM CHLORIDE 0.9 % WEIGHT BASED INFUSION
1.0000 mL/kg/h | INTRAVENOUS | Status: AC
  Administered 2023-07-05: 1 mL/kg/h via INTRAVENOUS

## 2023-07-05 MED ORDER — SODIUM CHLORIDE 0.9% FLUSH
3.0000 mL | Freq: Two times a day (BID) | INTRAVENOUS | Status: DC
  Administered 2023-07-06: 3 mL via INTRAVENOUS

## 2023-07-05 MED ORDER — MIDAZOLAM HCL 2 MG/2ML IJ SOLN
INTRAMUSCULAR | Status: DC | PRN
  Administered 2023-07-05: 1 mg via INTRAVENOUS

## 2023-07-05 SURGICAL SUPPLY — 11 items
CATH 5FR JL3.5 JR4 ANG PIG MP (CATHETERS) IMPLANT
DEVICE RAD COMP TR BAND LRG (VASCULAR PRODUCTS) IMPLANT
GLIDESHEATH SLEND SS 6F .021 (SHEATH) IMPLANT
GUIDEWIRE INQWIRE 1.5J.035X260 (WIRE) IMPLANT
INQWIRE 1.5J .035X260CM (WIRE) ×1
KIT HEART LEFT (KITS) ×1 IMPLANT
PACK CARDIAC CATHETERIZATION (CUSTOM PROCEDURE TRAY) ×1 IMPLANT
SHEATH PROBE COVER 6X72 (BAG) IMPLANT
TRANSDUCER W/STOPCOCK (MISCELLANEOUS) ×1 IMPLANT
TUBING CIL FLEX 10 FLL-RA (TUBING) ×1 IMPLANT
WIRE HI TORQ VERSACORE-J 145CM (WIRE) IMPLANT

## 2023-07-05 NOTE — Progress Notes (Signed)
Patient arrived with fluids at 80 and heparin gtt at 7. Denies chest pain. Hemodynamically stable. Awaiting team for cath lab pickup.

## 2023-07-05 NOTE — H&P (Signed)
Cardiology Admission History and Physical   Patient ID: Aaisha Backs MRN: [id number]; DOB: [birth year]   Admission date: 07/05/2023  PCP:  No primary care provider on file.   [facility] Providers Cardiologist:  None        Chief Complaint:  chest pain  Patient Profile:   Joanne Garner is a 57 y.o. female with history of hypertension (off meds x2 years), hyperlipidemia, tobacco use, anxiety who is being seen 07/05/2023 for the evaluation of chest pain.   History of Present Illness:   Ms. Knauff presented to [facility] ED on 07/05/23 reporting symptoms of nonspecific chest heaviness with pain in bilateral arms/shoulders. Per patient, she actually didn't go to the ED because of these symptoms, but rather because she had a bump on the right side of her face that she was concerned "she needed blood work for." While there, she was noted to be hypertensive with BP up to 204/96. Because of this BP and question of anginal like symptoms, cardiac workup started. 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 While doing this, she felt a little more tired than usual and had sensation of her arms being heavy/weak. Symptoms improved with rest but she did have some recurrence of this today. In the ED at [facility], patient found with troponin 0.45->1.2 and decision made to admit to the hospital for further evaluation with suspicion of NSTEMI. Heparin initiated. Following cardiology consult at [facility], decision made to transfer patient to [facility] for [facility]. Regarding cardiovascular risk factors, patient continues to smoke between 1/2 and 1 pack per day. She also reports drinking a few alcoholic beverages per week. Says that she has not taken anti-hypertensive medication or cholesterol medication regularly for quite some time though did mention occasionally taking 1/2 of a "small blood pressure pill" prescribed to her sister when she does find BP is elevated.      No past medical history on file.  The histories are not reviewed yet. Please review them in the "History" navigator section and refresh this SmartLink.   Medications Prior to Admission: Prior to Admission medications   Not on File     Allergies:   Not on File  Social History:   Social History   Socioeconomic History   Marital status: Married    Spouse name: Not on file   Number of children: Not on file   Years of education: Not on file   Highest education level: Not on file  Occupational History   Not on file  Tobacco Use   Smoking status: Not on file   Smokeless tobacco: Not on file  Substance and Sexual Activity   Alcohol use: Not on file   Drug use: Not on file   Sexual activity: Not on file  Other Topics Concern   Not on file  Social History Narrative   Not on file   Social Determinants of Health   Financial Resource Strain: Not on file  Food Insecurity: Not on file  Transportation Needs: Not on file  Physical Activity: Not on file  Stress: Not on file  Social Connections: Not on file  Intimate Partner Violence: Not on file    Family History:   The patient's family history is not on file.    ROS:  Please see the history of present illness.  All other ROS reviewed and negative.     Physical Exam/Data:   Vitals:   07/05/23 1510  Weight: 60.3 kg   No intake or output data in the 24 hours ending 07/05/23 1518    07/05/2023    3:10 PM  Last 3 Weights  Weight (lbs) 133 lb  Weight (kg) 60.328 kg     There is no height or weight on file to calculate BMI.  General:  Well nourished, well developed, in no acute distress HEENT: normal Neck: no JVD Vascular: No carotid bruits; Distal pulses 2+ bilaterally   Cardiac:  normal S1, S2; RRR; no murmur  Lungs:  clear to auscultation bilaterally, no wheezing, rhonchi or rales  Abd: soft, nontender, no hepatomegaly  Ext: no edema Musculoskeletal:  No deformities, BUE and BLE strength normal and  equal Skin: warm and dry  Neuro:  CNs 2-12 intact, no focal abnormalities noted Psych:  Normal affect    EKG:  The ECG that was done at [place] ED was personally reviewed and demonstrates sinus rhythm with left axis deviation and likely LVH.   Relevant CV Studies:  Echo results from [facility] requested.  Laboratory Data:  High Sensitivity Troponin:  No results for input(s): "TROPONINIHS" in the last 720 hours.    ChemistryNo results for input(s): "NA", "K", "CL", "CO2", "GLUCOSE", "BUN", "CREATININE", "CALCIUM", "MG", "GFRNONAA", "GFRAA", "ANIONGAP" in the last 168 hours.  No results for input(s): "PROT", "ALBUMIN", "AST", "ALT", "ALKPHOS", "BILITOT" in the last 168 hours. Lipids No results for input(s): "CHOL", "TRIG", "HDL", "LABVLDL", "LDLCALC", "CHOLHDL" in the last 168 hours. HematologyNo results for input(s): "WBC", "RBC", "HGB", "HCT", "MCV", "MCH", "MCHC", "RDW", "PLT" in the last 168 hours. Thyroid No results for input(s): "TSH", "FREET4" in the last 168 hours. BNPNo results for input(s): "BNP", "PROBNP" in the last 168 hours.  DDimer No results for input(s): "DDIMER" in the last 168 hours.   Radiology/Studies:  No results found.   Assessment and Plan:   Elevated troponin NSTEMI vs demand ischemia  Patient with cardiac risk factors including htn, hld, tobacco use presented to [facility] ED today with bump on face but also reporting chest heaviness, found with elevated troponin 0.45->1.20. Patient initiated on heparin and transfer arranged to [facility] for [facility].  LHC today to clarify NSTEMI vs demand ischemia in setting of hypertension. Patient underwent echocardiogram at [facility], we have requested this report. CAD GDMT pending per LHC findings  Informed Consent   Shared Decision Making/Informed Consent The risks [stroke (1 in 1000), death (1 in 1000), kidney failure [usually temporary] (1 in 500), bleeding (1 in 200), allergic reaction [possibly serious] (1 in 200)],  benefits (diagnostic support and management of coronary artery disease) and alternatives of a cardiac catheterization were discussed in detail with Ms. Tiemeyer and she is willing to proceed.     Hyperlipidemia  Lipid panel at [facility] found total cholesterol 212, LDL 118, HDL 73. Will start initiate statin therapy this admission.  Hypertension  Patient with hx hypertension has been off medications for the last year and a half. She was found with BP up to 204/96 at OSH and received Lopressor. Plan to optimize anti-hypertensive regimen this admission. Per patient, she has previously done well with lisinopril.   Risk Assessment/Risk Scores:    TIMI Risk Score for Unstable Angina or Non-ST Elevation MI:   The patient's TIMI risk score is 2, which indicates a 8% risk of all cause mortality, new or recurrent myocardial infarction or need for urgent revascularization in the next 14 days.      Code Status: Full Code  Severity of Illness: The  appropriate patient status for this patient is OBSERVATION. Observation status is judged to be reasonable and necessary in order to provide the required intensity of service to ensure the patient's safety. The patient's presenting symptoms, physical exam findings, and initial radiographic and laboratory data in the context of their medical condition is felt to place them at decreased risk for further clinical deterioration. Furthermore, it is anticipated that the patient will be medically stable for discharge from the hospital within 2 midnights of admission.    For questions or updates, please contact [facility] Please consult [email or web address] for contact info under     Signed, Perlie Gold, PA-C  07/05/2023 3:18 PM

## 2023-07-05 NOTE — Progress Notes (Signed)
ANTICOAGULATION CONSULT NOTE  Pharmacy Consult for heparin Indication: chest pain/ACS  Heparin Dosing Weight: 60.3 kg  Labs: No results for input(s): "HGB", "HCT", "PLT", "APTT", "LABPROT", "INR", "HEPARINUNFRC", "HEPRLOWMOCWT", "CREATININE", "CKTOTAL", "CKMB", "TROPONINI" in the last 72 hours.  Assessment: 57 yof transferred from OSH for cath after presenting with CP and elevated troponin. Now s/p cath 7/12 showing significant complex distal left main disease. Awaiting CVTS evaluation. Pharmacy consulted to resume heparin 2 hours post-TR band removal (removed at 1930 per RN). Patient is not on anticoagulation PTA. CBC pending. No bleed issues reported.  Goal of Therapy:  Heparin level 0.3-0.7 units/ml Monitor platelets by anticoagulation protocol: Yes   Plan:  No bolus. Start heparin at 750 units/hr at 2130 (2 hrs post-TR band removal) Check 6hr heparin level from start Monitor daily CBC, s/sx bleeding   Leia Alf, PharmD, BCPS Clinical Pharmacist 07/05/2023 4:22 PM

## 2023-07-05 NOTE — Interval H&P Note (Signed)
History and Physical Interval Note:  07/05/2023 3:21 PM  Joanne Garner  has presented today for surgery, with the diagnosis of nstemi.  The various methods of treatment have been discussed with the patient and family. After consideration of risks, benefits and other options for treatment, the patient has consented to  Procedure(s): LEFT HEART CATH AND CORONARY ANGIOGRAPHY (N/A) as a surgical intervention.  The patient's history has been reviewed, patient examined, no change in status, stable for surgery.  I have reviewed the patient's chart and labs.  Questions were answered to the patient's satisfaction.   Cath Lab Visit (complete for each Cath Lab visit)  Clinical Evaluation Leading to the Procedure:   ACS: Yes.    Non-ACS:    Anginal Classification: CCS II  Anti-ischemic medical therapy: No Therapy  Non-Invasive Test Results: No non-invasive testing performed  Prior CABG: No previous CABG        Theron Arista [facility] 07/05/2023 3:21 PM

## 2023-07-05 NOTE — Progress Notes (Signed)
TR BAND REMOVAL  LOCATION:    right radial  DEFLATED PER PROTOCOL:    Yes.    TIME BAND OFF / DRESSING APPLIED:    1930   SITE UPON ARRIVAL:    Level 0  SITE AFTER BAND REMOVAL:    Level 0  CIRCULATION SENSATION AND MOVEMENT:    Within Normal Limits   Yes.    COMMENTS:  Sterile dressing applied, good capillary refill, pt instructed post cardiac cath site care with understanding.

## 2023-07-06 ENCOUNTER — Inpatient Hospital Stay (HOSPITAL_COMMUNITY): Payer: Managed Care, Other (non HMO)

## 2023-07-06 DIAGNOSIS — Z01810 Encounter for preprocedural cardiovascular examination: Secondary | ICD-10-CM | POA: Diagnosis not present

## 2023-07-06 DIAGNOSIS — I214 Non-ST elevation (NSTEMI) myocardial infarction: Secondary | ICD-10-CM | POA: Diagnosis not present

## 2023-07-06 DIAGNOSIS — I2510 Atherosclerotic heart disease of native coronary artery without angina pectoris: Secondary | ICD-10-CM

## 2023-07-06 LAB — CBC
HCT: 38.3 % (ref 36.0–46.0)
Hemoglobin: 12.6 g/dL (ref 12.0–15.0)
MCH: 31.8 pg (ref 26.0–34.0)
MCHC: 32.9 g/dL (ref 30.0–36.0)
MCV: 96.7 fL (ref 80.0–100.0)
Platelets: 154 10*3/uL (ref 150–400)
RBC: 3.96 MIL/uL (ref 3.87–5.11)
RDW: 12.8 % (ref 11.5–15.5)
WBC: 3.8 10*3/uL — ABNORMAL LOW (ref 4.0–10.5)
nRBC: 0 % (ref 0.0–0.2)

## 2023-07-06 LAB — ECHOCARDIOGRAM COMPLETE
AR max vel: 1.58 cm2
AV Peak grad: 6.4 mmHg
Ao pk vel: 1.26 m/s
Area-P 1/2: 2.87 cm2
Calc EF: 44.6 %
S' Lateral: 2.1 cm
Single Plane A2C EF: 39.9 %
Single Plane A4C EF: 33.4 %
Weight: 2128 oz

## 2023-07-06 LAB — BASIC METABOLIC PANEL
Anion gap: 7 (ref 5–15)
BUN: 13 mg/dL (ref 6–20)
CO2: 26 mmol/L (ref 22–32)
Calcium: 9.1 mg/dL (ref 8.9–10.3)
Chloride: 103 mmol/L (ref 98–111)
Creatinine, Ser: 0.71 mg/dL (ref 0.44–1.00)
GFR, Estimated: >60 mL/min (ref >60)
Glucose, Bld: 120 mg/dL — ABNORMAL HIGH (ref 70–99)
Potassium: 4.2 mmol/L (ref 3.5–5.1)
Sodium: 136 mmol/L (ref 135–145)

## 2023-07-06 LAB — HEPARIN LEVEL (UNFRACTIONATED)
Heparin Unfractionated: 0.16 IU/mL — ABNORMAL LOW (ref 0.30–0.70)
Heparin Unfractionated: 0.24 IU/mL — ABNORMAL LOW (ref 0.30–0.70)
Heparin Unfractionated: 0.42 IU/mL (ref 0.30–0.70)

## 2023-07-06 LAB — HIV ANTIBODY (ROUTINE TESTING W REFLEX): HIV Screen 4th Generation wRfx: NONREACTIVE

## 2023-07-06 MED ORDER — PERFLUTREN LIPID MICROSPHERE
1.0000 mL | INTRAVENOUS | Status: AC | PRN
  Administered 2023-07-06: 5 mL via INTRAVENOUS

## 2023-07-06 NOTE — Progress Notes (Signed)
ANTICOAGULATION CONSULT NOTE  Pharmacy Consult for heparin Indication: chest pain/ACS  Heparin Dosing Weight: 60.3 kg  Labs: Recent Labs    07/06/23 0400 07/06/23 1259 07/06/23 2055  HGB 12.6  --   --   HCT 38.3  --   --   PLT 154  --   --   HEPARINUNFRC 0.16* 0.24* 0.42  CREATININE 0.71  --   --     Assessment: 57 yof transferred from OSH for cath after presenting with CP and elevated troponin. Now s/p cath 7/12 showing significant complex distal left main disease. Awaiting CVTS evaluation. Pharmacy consulted to resume heparin 2 hours post-TR band removal (removed at 1930 per RN). Patient is not on anticoagulation PTA.  Heparin level therapeutic s/p rate change to 1050 units/hr  Goal of Therapy:  Heparin level 0.3-0.7 units/ml Monitor platelets by anticoagulation protocol: Yes   Plan:  Continue heparin gtt at 1050 units/hr Daily heparin level, CBC, s/s bleeding F/u CTS plans  Daylene Posey, PharmD, [facility] Clinical Pharmacist ED Pharmacist Phone # [phone] 07/06/2023 10:00 PM

## 2023-07-06 NOTE — Progress Notes (Signed)
Pre-CABG study completed.  ° °Please see CV Proc for preliminary results.  ° °Geraldy Akridge, RDMS, RVT ° °

## 2023-07-06 NOTE — Progress Notes (Signed)
   Rounding Note    Patient Name: Joanne Garner Date of Encounter: 07/06/2023  [facility] Cardiologist: None   Subjective   NAEO. No CP this AM.  Vital Signs    Vitals:   07/06/23 0320 07/06/23 0325 07/06/23 0330 07/06/23 0713  BP: 121/75   125/73  Pulse: (!) 50 (!) 51 (!) 53 65  Resp: 18   18  Temp: 98.3 F (36.8 C)   97.9 F (36.6 C)  TempSrc: Tympanic   Oral  SpO2: 100% 99% 98% 98%  Weight:        Intake/Output Summary (Last 24 hours) at 07/06/2023 0902 Last data filed at 07/05/2023 1900 Gross per 24 hour  Intake 365.63 ml  Output --  Net 365.63 ml      07/05/2023    3:10 PM  Last 3 Weights  Weight (lbs) 133 lb  Weight (kg) 60.328 kg      Telemetry    Personally Reviewed  ECG    Personally Reviewed  Physical Exam   GEN: No acute distress.   Cardiac: RRR, no murmurs, rubs, or gallops.  Respiratory: Clear to auscultation bilaterally. Psych: Normal affect   Assessment & Plan    #NSTEMI #CAD Complex distal LM stenosis involving the Lcx, Diag and LAD.  CTS consult pending Echo pending Cont ASA Cont Coreg Cont heparin Cont statin  #HTN At goal this AM. Continue to monitor.        Sheria Lang T. Lalla Brothers, MD, [facility], [facility] Cardiac Electrophysiology

## 2023-07-06 NOTE — Progress Notes (Signed)
     [address]       [name] [address]             [phone]       Full note to follow Awaiting Echo. Anatomy suitable for surgical revascularization.  Kayleann Mccaffery Keane Scrape

## 2023-07-06 NOTE — Progress Notes (Signed)
Echocardiogram 2D Echocardiogram has been performed.  Joanne Garner 07/06/2023, 11:33 AM

## 2023-07-06 NOTE — Progress Notes (Signed)
ANTICOAGULATION CONSULT NOTE  Pharmacy Consult for heparin Indication: chest pain/ACS  Heparin Dosing Weight: 60.3 kg  Labs: Recent Labs    07/06/23 0400 07/06/23 1259  HGB 12.6  --   HCT 38.3  --   PLT 154  --   HEPARINUNFRC 0.16* 0.24*  CREATININE 0.71  --     Assessment: 57 yof transferred from OSH for cath after presenting with CP and elevated troponin. Now s/p cath 7/12 showing significant complex distal left main disease. Awaiting CVTS evaluation. Pharmacy consulted to resume heparin 2 hours post-TR band removal (removed at 1930 per RN). Patient is not on anticoagulation PTA.  Heparin level subtherapeutic at 0.24. No bleed issues reported. CBC wnl.   Goal of Therapy:  Heparin level 0.3-0.7 units/ml Monitor platelets by anticoagulation protocol: Yes   Plan:  Inc heparin to 1050 units/hr 2030 heparin level  Thank you for involving pharmacy in the patient's care.   Theotis Burrow, PharmD PGY1 Acute Care Pharmacy Resident  07/06/2023 1:47 PM

## 2023-07-06 NOTE — Progress Notes (Signed)
ANTICOAGULATION CONSULT NOTE  Pharmacy Consult for heparin Indication: chest pain/ACS  Heparin Dosing Weight: 60.3 kg  Labs: Recent Labs    07/06/23 0400  HGB 12.6  HCT 38.3  PLT 154  HEPARINUNFRC 0.16*  CREATININE 0.71    Assessment: 57 yof transferred from OSH for cath after presenting with CP and elevated troponin. Now s/p cath 7/12 showing significant complex distal left main disease. Awaiting CVTS evaluation. Pharmacy consulted to resume heparin 2 hours post-TR band removal (removed at 1930 per RN). Patient is not on anticoagulation PTA. CBC pending. No bleed issues reported.  7/13 AM update:  Heparin level sub-therapeutic  Awaiting CVTS eval  Goal of Therapy:  Heparin level 0.3-0.7 units/ml Monitor platelets by anticoagulation protocol: Yes   Plan:  Inc heparin to 900 units/hr 1300 heparin level  Abran Duke, PharmD, BCPS Clinical Pharmacist Phone: [phone]

## 2023-07-06 NOTE — Plan of Care (Signed)
  Problem: Education: Goal: Understanding of CV disease, CV risk reduction, and recovery process will improve Outcome: Progressing Goal: Individualized Educational Video(s) Outcome: Not Applicable   Problem: Activity: Goal: Ability to return to baseline activity level will improve Outcome: Progressing   Problem: Cardiovascular: Goal: Ability to achieve and maintain adequate cardiovascular perfusion will improve Outcome: Progressing Goal: Vascular access site(s) Level 0-1 will be maintained Outcome: Completed/Met

## 2023-07-07 ENCOUNTER — Other Ambulatory Visit: Payer: Self-pay

## 2023-07-07 ENCOUNTER — Encounter (HOSPITAL_COMMUNITY): Payer: Self-pay | Admitting: Cardiology

## 2023-07-07 ENCOUNTER — Inpatient Hospital Stay (HOSPITAL_COMMUNITY): Payer: Managed Care, Other (non HMO)

## 2023-07-07 DIAGNOSIS — I214 Non-ST elevation (NSTEMI) myocardial infarction: Secondary | ICD-10-CM

## 2023-07-07 DIAGNOSIS — I25110 Atherosclerotic heart disease of native coronary artery with unstable angina pectoris: Secondary | ICD-10-CM

## 2023-07-07 LAB — ABO/RH: ABO/RH(D): A NEG

## 2023-07-07 LAB — CBC
HCT: 38.3 % (ref 36.0–46.0)
Hemoglobin: 12.6 g/dL (ref 12.0–15.0)
MCH: 31.7 pg (ref 26.0–34.0)
MCHC: 32.9 g/dL (ref 30.0–36.0)
MCV: 96.2 fL (ref 80.0–100.0)
Platelets: 139 10*3/uL — ABNORMAL LOW (ref 150–400)
RBC: 3.98 MIL/uL (ref 3.87–5.11)
RDW: 12.5 % (ref 11.5–15.5)
WBC: 4.7 10*3/uL (ref 4.0–10.5)
nRBC: 0 % (ref 0.0–0.2)

## 2023-07-07 LAB — BLOOD GAS, ARTERIAL
Acid-Base Excess: 1.3 mmol/L (ref 0.0–2.0)
Bicarbonate: 25.9 mmol/L (ref 20.0–28.0)
Drawn by: 164
O2 Saturation: 98.6 %
Patient temperature: 37
pCO2 arterial: 40 mmHg (ref 32–48)
pH, Arterial: 7.42 (ref 7.35–7.45)
pO2, Arterial: 88 mmHg (ref 83–108)

## 2023-07-07 LAB — URINALYSIS, ROUTINE W REFLEX MICROSCOPIC
Bacteria, UA: NONE SEEN
Bilirubin Urine: NEGATIVE
Glucose, UA: NEGATIVE mg/dL
Ketones, ur: NEGATIVE mg/dL
Leukocytes,Ua: NEGATIVE
Nitrite: NEGATIVE
Protein, ur: NEGATIVE mg/dL
Specific Gravity, Urine: 1.013 (ref 1.005–1.030)
pH: 5 (ref 5.0–8.0)

## 2023-07-07 LAB — HEMOGLOBIN A1C
Hgb A1c MFr Bld: 6.1 % — ABNORMAL HIGH (ref 4.8–5.6)
Mean Plasma Glucose: 128.37 mg/dL

## 2023-07-07 LAB — HEPARIN LEVEL (UNFRACTIONATED): Heparin Unfractionated: 0.56 IU/mL (ref 0.30–0.70)

## 2023-07-07 LAB — PROTIME-INR
INR: 1 (ref 0.8–1.2)
Prothrombin Time: 13.7 seconds (ref 11.4–15.2)

## 2023-07-07 LAB — SURGICAL PCR SCREEN
MRSA, PCR: NEGATIVE
Staphylococcus aureus: NEGATIVE

## 2023-07-07 LAB — APTT: aPTT: 71 seconds — ABNORMAL HIGH (ref 24–36)

## 2023-07-07 NOTE — Progress Notes (Signed)
ANTICOAGULATION CONSULT NOTE  Pharmacy Consult for heparin Indication: chest pain/ACS  Heparin Dosing Weight: 60.3 kg  Labs: Recent Labs    07/06/23 0400 07/06/23 1259 07/06/23 2055 07/07/23 0148  HGB 12.6  --   --  12.6  HCT 38.3  --   --  38.3  PLT 154  --   --  139*  HEPARINUNFRC 0.16* 0.24* 0.42 0.56  CREATININE 0.71  --   --   --     Assessment: 57 yof transferred from OSH for cath after presenting with CP and elevated troponin. Now s/p cath 7/12 showing significant complex distal left main disease. Awaiting CVTS evaluation. Pharmacy consulted to resume heparin 2 hours post-TR band removal (removed at 1930 per RN). Patient is not on anticoagulation PTA.  Heparin level therapeutic on 1050 units/hr. CBC wnl. No signs of bleeding.   Goal of Therapy:  Heparin level 0.3-0.7 units/ml Monitor platelets by anticoagulation protocol: Yes   Plan:  Continue heparin gtt at 1050 units/hr Daily heparin level, CBC, s/s bleeding F/u CTS plans  Thank you for involving pharmacy in the patient's care.   Theotis Burrow, PharmD PGY1 Acute Care Pharmacy Resident  07/07/2023 9:06 AM

## 2023-07-07 NOTE — Progress Notes (Signed)
   Rounding Note    Patient Name: Joanne Garner Date of Encounter: 07/07/2023  [facility] Cardiologist: None   Subjective   NAEO. No CP this AM. Husband on phone this AM.  Vital Signs    Vitals:   07/06/23 1615 07/06/23 1910 07/07/23 0401 07/07/23 0829  BP: 130/88 (!) 139/92 121/80 128/83  Pulse: 73 72 (!) 57 62  Resp: 16 18 18    Temp: 98.1 F (36.7 C) 98.7 F (37.1 C) 97.9 F (36.6 C)   TempSrc: Oral Oral Oral   SpO2: 97% 100%    Weight:        Intake/Output Summary (Last 24 hours) at 07/07/2023 0945 Last data filed at 07/07/2023 0411 Gross per 24 hour  Intake 276.8 ml  Output --  Net 276.8 ml      07/05/2023    3:10 PM  Last 3 Weights  Weight (lbs) 133 lb  Weight (kg) 60.328 kg      Telemetry    Personally Reviewed  ECG    Personally Reviewed  Physical Exam   GEN: No acute distress.   Cardiac: RRR, no murmurs, rubs, or gallops.  Respiratory: Clear to auscultation bilaterally. Psych: Normal affect   Assessment & Plan    #NSTEMI #CAD Complex distal LM stenosis involving the Lcx, Diag and LAD.  CTS consult pending Echo with reduced EF 30% and apical akinesis. Cont ASA Cont Coreg Cont heparin Cont statin  #HTN At goal this AM. Continue to monitor.   Sheria Lang T. Lalla Brothers, MD, [facility], [facility] Cardiac Electrophysiology

## 2023-07-07 NOTE — Consult Note (Signed)
[address]       [place] [address]             [phone]        Lynden Borak [facility] Medical Record #[id number] Date of Birth: 03/31/1966  Referring: No ref. provider found Primary Care: Patient, No Pcp Per Primary Cardiologist:None  Chief Complaint:   No chief complaint on file.   History of Present Illness:     Joanne Garner 57 y.o. female presents for surgical evaluation of a LM disease.  She was transferred from [facility] with anginal symptoms.  She was ruled in for an NSTEMI.  CTS has been consulted to assist with management.     Past Medical and Surgical History: Previous Chest Surgery: no Previous Chest Radiation: no Diabetes Mellitus: no.  HbA1C pending Creatinine:  Lab Results  Component Value Date   CREATININE 0.71 07/06/2023     No past medical history on file.    Social History:  Social History   Tobacco Use  Smoking Status Not on file  Smokeless Tobacco Not on file    Social History   Substance and Sexual Activity  Alcohol Use Not on file     Not on File    Current Facility-Administered Medications  Medication Dose Route Frequency Provider Last Rate Last Admin   0.9 %  sodium chloride infusion  250 mL Intravenous PRN [place], Peter M, MD       acetaminophen (TYLENOL) tablet 650 mg  650 mg Oral Q4H PRN [place], Peter M, MD   650 mg at 07/06/23 1916   aspirin EC tablet 81 mg  81 mg Oral Daily [place], Peter M, MD   81 mg at 07/07/23 0829   carvedilol (COREG) tablet 6.25 mg  6.25 mg Oral BID WC [place], Peter M, MD   6.25 mg at 07/07/23 0829   heparin ADULT infusion 100 units/mL (25000 units/259mL)  1,050 Units/hr Intravenous Continuous Theotis Burrow I, RPH 10.5 mL/hr at 07/07/23 0101 1,050 Units/hr at 07/07/23 0101   isosorbide mononitrate (IMDUR) 24 hr tablet 30 mg  30 mg Oral Daily [place], Peter M, MD   30 mg at 07/07/23 0829   nitroGLYCERIN (NITROSTAT) SL tablet 0.4 mg  0.4 mg Sublingual  Q5 Min x 3 PRN [place], Peter M, MD       ondansetron [facility]) injection 4 mg  4 mg Intravenous Q6H PRN [place], Peter M, MD       rosuvastatin (CRESTOR) tablet 20 mg  20 mg Oral Daily [place], Peter M, MD   20 mg at 07/07/23 0829   sodium chloride flush (NS) 0.9 % injection 3 mL  3 mL Intravenous Q12H [place], Peter M, MD   3 mL at 07/06/23 0850   sodium chloride flush (NS) 0.9 % injection 3 mL  3 mL Intravenous PRN [place], Peter M, MD        No medications prior to admission.    No family history on file.   Review of Systems:   Review of Systems  Constitutional:  Positive for malaise/fatigue.  Respiratory:  Negative for shortness of breath.   Cardiovascular:  Negative for chest pain.  Neurological: Negative.       Physical Exam: BP 128/83   Pulse 62   Temp 97.9 F (36.6 C) (Oral)   Resp 18   Wt 60.3 kg   SpO2 100%  Physical Exam Constitutional:      General: She is not  in acute distress.    Appearance: She is ill-appearing.  Eyes:     Extraocular Movements: Extraocular movements intact.  Cardiovascular:     Rate and Rhythm: Normal rate.  Pulmonary:     Effort: Pulmonary effort is normal. No respiratory distress.  Abdominal:     General: Abdomen is flat. There is no distension.  Musculoskeletal:        General: Normal range of motion.     Cervical back: Normal range of motion.  Skin:    General: Skin is warm and dry.  Neurological:     General: No focal deficit present.     Mental Status: She is alert and oriented to person, place, and time.        Recent Lab Findings: Lab Results  Component Value Date   WBC 4.7 07/07/2023   HGB 12.6 07/07/2023   HCT 38.3 07/07/2023   PLT 139 (L) 07/07/2023   GLUCOSE 120 (H) 07/06/2023   NA 136 07/06/2023   K 4.2 07/06/2023   CL 103 07/06/2023   CREATININE 0.71 07/06/2023   BUN 13 07/06/2023   CO2 26 07/06/2023      Assessment / Plan:   57yo female with LM disease, and reduced LV function. She has no significant  valvular disease.   She will need bypass to the LAD, ramus and OM.  OR early this week.       [name] 07/07/2023 9:46 AM

## 2023-07-08 ENCOUNTER — Encounter (HOSPITAL_COMMUNITY): Payer: Self-pay | Admitting: Cardiology

## 2023-07-08 ENCOUNTER — Inpatient Hospital Stay (HOSPITAL_COMMUNITY)
Admission: RE | Admit: 2023-07-08 | Discharge: 2023-07-08 | Disposition: A | Discharge status: Home / Self Care | Payer: Managed Care, Other (non HMO) | Source: Home / Self Care | Attending: Cardiology | Admitting: Cardiology

## 2023-07-08 DIAGNOSIS — I214 Non-ST elevation (NSTEMI) myocardial infarction: Secondary | ICD-10-CM | POA: Diagnosis not present

## 2023-07-08 LAB — HEPARIN LEVEL (UNFRACTIONATED): Heparin Unfractionated: 0.52 IU/mL (ref 0.30–0.70)

## 2023-07-08 LAB — CBC
HCT: 38.7 % (ref 36.0–46.0)
Hemoglobin: 12.6 g/dL (ref 12.0–15.0)
MCH: 31.8 pg (ref 26.0–34.0)
MCHC: 32.6 g/dL (ref 30.0–36.0)
MCV: 97.7 fL (ref 80.0–100.0)
Platelets: 138 10*3/uL — ABNORMAL LOW (ref 150–400)
RBC: 3.96 MIL/uL (ref 3.87–5.11)
RDW: 12.4 % (ref 11.5–15.5)
WBC: 4.5 10*3/uL (ref 4.0–10.5)
nRBC: 0 % (ref 0.0–0.2)

## 2023-07-08 LAB — PULMONARY FUNCTION TEST
FEF 25-75 Pre: 0.66 L/sec
FEF2575-%Pred-Pre: 27 %
FEV1-%Pred-Pre: 62 %
FEV1-Pre: 1.62 L
FEV1FVC-%Pred-Pre: 82 %
FEV6-%Pred-Pre: 72 %
FEV6-Pre: 2.34 L
FEV6FVC-%Pred-Pre: 98 %
FVC-%Pred-Pre: 75 %
FVC-Pre: 2.52 L
Pre FEV1/FVC ratio: 64 %
Pre FEV6/FVC Ratio: 95 %

## 2023-07-08 LAB — PREPARE RBC (CROSSMATCH)

## 2023-07-08 LAB — SARS CORONAVIRUS 2 BY RT PCR: SARS Coronavirus 2 by RT PCR: NEGATIVE

## 2023-07-08 MED ORDER — CHLORHEXIDINE GLUCONATE CLOTH 2 % EX PADS
6.0000 | MEDICATED_PAD | Freq: Once | CUTANEOUS | Status: AC
  Administered 2023-07-08: 6 via TOPICAL

## 2023-07-08 MED ORDER — DEXMEDETOMIDINE HCL IN NACL 400 MCG/100ML IV SOLN
0.1000 ug/kg/h | INTRAVENOUS | Status: AC
  Administered 2023-07-09: .7 ug/kg/h via INTRAVENOUS
  Filled 2023-07-08: qty 100

## 2023-07-08 MED ORDER — TRANEXAMIC ACID 1000 MG/10ML IV SOLN
1.5000 mg/kg/h | INTRAVENOUS | Status: AC
  Administered 2023-07-09: 1.5 mg/kg/h via INTRAVENOUS
  Filled 2023-07-08: qty 25

## 2023-07-08 MED ORDER — CHLORHEXIDINE GLUCONATE CLOTH 2 % EX PADS
6.0000 | MEDICATED_PAD | Freq: Once | CUTANEOUS | Status: AC
  Administered 2023-07-09: 6 via TOPICAL

## 2023-07-08 MED ORDER — NITROGLYCERIN IN D5W 200-5 MCG/ML-% IV SOLN
2.0000 ug/min | INTRAVENOUS | Status: DC
  Filled 2023-07-08: qty 250

## 2023-07-08 MED ORDER — METOPROLOL TARTRATE 12.5 MG HALF TABLET
12.5000 mg | ORAL_TABLET | Freq: Once | ORAL | Status: AC
  Administered 2023-07-09: 12.5 mg via ORAL
  Filled 2023-07-08: qty 1

## 2023-07-08 MED ORDER — POTASSIUM CHLORIDE 2 MEQ/ML IV SOLN
80.0000 meq | INTRAVENOUS | Status: DC
  Filled 2023-07-08: qty 40

## 2023-07-08 MED ORDER — NOREPINEPHRINE 4 MG/250ML-% IV SOLN
0.0000 ug/min | INTRAVENOUS | Status: DC
  Filled 2023-07-08: qty 250

## 2023-07-08 MED ORDER — PLASMA-LYTE A IV SOLN
INTRAVENOUS | Status: DC
  Filled 2023-07-08: qty 2.5

## 2023-07-08 MED ORDER — BISACODYL 5 MG PO TBEC: 5.0000 mg | DELAYED_RELEASE_TABLET | Freq: Once | ORAL | Status: DC

## 2023-07-08 MED ORDER — INSULIN REGULAR(HUMAN) IN NACL 100-0.9 UT/100ML-% IV SOLN
INTRAVENOUS | Status: AC
  Administered 2023-07-09: .7 [IU]/h via INTRAVENOUS
  Filled 2023-07-08: qty 100

## 2023-07-08 MED ORDER — PHENYLEPHRINE HCL-NACL 20-0.9 MG/250ML-% IV SOLN
30.0000 ug/min | INTRAVENOUS | Status: AC
  Administered 2023-07-09: 15 ug/min via INTRAVENOUS
  Filled 2023-07-08: qty 250

## 2023-07-08 MED ORDER — EPINEPHRINE HCL 5 MG/250ML IV SOLN IN NS
0.0000 ug/min | INTRAVENOUS | Status: DC
  Filled 2023-07-08: qty 250

## 2023-07-08 MED ORDER — TRANEXAMIC ACID (OHS) BOLUS VIA INFUSION
15.0000 mg/kg | INTRAVENOUS | Status: AC
  Administered 2023-07-09: 904.5 mg via INTRAVENOUS
  Filled 2023-07-08: qty 905

## 2023-07-08 MED ORDER — MILRINONE LACTATE IN DEXTROSE 20-5 MG/100ML-% IV SOLN
0.3000 ug/kg/min | INTRAVENOUS | Status: DC
  Filled 2023-07-08: qty 100

## 2023-07-08 MED ORDER — CEFAZOLIN SODIUM-DEXTROSE 2-4 GM/100ML-% IV SOLN
2.0000 g | INTRAVENOUS | Status: AC
  Administered 2023-07-09: 2 g via INTRAVENOUS
  Filled 2023-07-08: qty 100

## 2023-07-08 MED ORDER — MANNITOL 20 % IV SOLN
INTRAVENOUS | Status: DC
  Filled 2023-07-08: qty 13

## 2023-07-08 MED ORDER — CHLORHEXIDINE GLUCONATE 0.12 % MT SOLN
15.0000 mL | Freq: Once | OROMUCOSAL | Status: AC
  Administered 2023-07-09: 15 mL via OROMUCOSAL
  Filled 2023-07-08: qty 15

## 2023-07-08 MED ORDER — HEPARIN 30,000 UNITS/1000 ML (OHS) CELLSAVER SOLUTION
Status: DC
  Filled 2023-07-08: qty 1000

## 2023-07-08 MED ORDER — TRANEXAMIC ACID (OHS) PUMP PRIME SOLUTION
2.0000 mg/kg | INTRAVENOUS | Status: DC
  Filled 2023-07-08: qty 1.21

## 2023-07-08 MED ORDER — VANCOMYCIN HCL 1250 MG/250ML IV SOLN
1250.0000 mg | INTRAVENOUS | Status: AC
  Administered 2023-07-09: 1250 mg via INTRAVENOUS
  Filled 2023-07-08: qty 250

## 2023-07-08 MED ORDER — TEMAZEPAM 7.5 MG PO CAPS
15.0000 mg | ORAL_CAPSULE | Freq: Once | ORAL | Status: AC | PRN
  Administered 2023-07-08: 15 mg via ORAL
  Filled 2023-07-08: qty 2

## 2023-07-08 NOTE — Progress Notes (Signed)
   Patient Name: Joanne Garner Date of Encounter: 07/08/2023 [facility] Cardiologist: None   Interval Summary  .    Patient resting comfortably in bedside chair. Reports that she has been making efforts to walk/stay active in the hospital. Denies chest pain, shortness of breath, palpitations.  Vital Signs .    Vitals:   07/07/23 2037 07/08/23 0406 07/08/23 0848 07/08/23 0851  BP: 115/70 128/67  130/84  Pulse: 60 (!) 50 (!) 56   Resp: 17 17    Temp: 98.1 F (36.7 C) 97.7 F (36.5 C)    TempSrc: Oral Oral    SpO2: 94% 92%    Weight:        Intake/Output Summary (Last 24 hours) at 07/08/2023 1015 Last data filed at 07/07/2023 2120 Gross per 24 hour  Intake 176.93 ml  Output --  Net 176.93 ml      07/05/2023    3:10 PM  Last 3 Weights  Weight (lbs) 133 lb  Weight (kg) 60.328 kg      Telemetry/ECG    Sinus rhythm - Personally Reviewed  Physical Exam .   GEN: No acute distress.   Neck: No JVD Cardiac: RRR, no murmurs, rubs, or gallops.  Respiratory: Clear to auscultation bilaterally. GI: Soft, nontender, non-distended  MS: No edema  Assessment & Plan .     NSTEMI CAD  Joanne Garner presented to [place] ED on 07/05/23 reporting symptoms of nonspecific chest heaviness with pain in bilateral arms/shoulders, was found with mildly elevated troponin. She was transferred to [facility] for LHC and found with complex distal left main stenosis, involving origin of LAD, high diagonal, ramus intermediate and Lcx. CTS was consulted and they plan for bypass to LAD, ramus, and OM tomorrow 7/16.  Continue Heparin Continue Coreg 6.25mg  BID Continue ASA 81mg  Continue Imdur 30mg  Continue rosuvastatin 20mg    HFrEF  TTE this admission with LVEF 35-40%, severe akinesis of LV mid-apical anteroseptal wall, anterior wall, apical segment, and inferior wall.  Patient euvolemic on exam. Continue Coreg 6.25mg  BID. Will defer adding GDMT until after CABG to avoid pre-operative  hypotension.   Hypertension  BP generally well controlled this admission. Continue coreg 6.25mg  BID.   Hyperlipidemia  Lipid panel at [facility] found total cholesterol 212, LDL 118, HDL 73. Continue Crestor 20mg .   For questions or updates, please contact [facility] Please consult [email or web address] for contact info under        Signed, Perlie Gold, PA-C

## 2023-07-08 NOTE — Progress Notes (Signed)
Pt received OHS book, Move in the Tube sheet, and OHS careguide. Pt was educated on approx length of surgery and stay, importance of ambulation and using IS, restrictions, home needs, and CRPII.   Pt reports she has shower chair, BSC, and RW at home. Pt reports that her nieces and nephews will be able to help her when d/c. Unfortunately pt husband was recently diagnosed with cancer and is undergoing treatment.   IS not on floor, all questions answered prior to leaving.  Faustino Congress MS, ACSM-CEP 07/08/2023 9:49 AM  [phone]

## 2023-07-09 ENCOUNTER — Inpatient Hospital Stay (HOSPITAL_COMMUNITY): Payer: Managed Care, Other (non HMO) | Admitting: Anesthesiology

## 2023-07-09 ENCOUNTER — Inpatient Hospital Stay (HOSPITAL_COMMUNITY): Payer: Managed Care, Other (non HMO)

## 2023-07-09 ENCOUNTER — Inpatient Hospital Stay (HOSPITAL_COMMUNITY)
Admission: RE | Disposition: A | Discharge status: Home / Self Care | Payer: Self-pay | Source: Home / Self Care | Attending: Thoracic Surgery (Cardiothoracic Vascular Surgery)

## 2023-07-09 ENCOUNTER — Encounter (HOSPITAL_COMMUNITY): Payer: Self-pay | Admitting: Cardiology

## 2023-07-09 ENCOUNTER — Other Ambulatory Visit: Payer: Self-pay

## 2023-07-09 ENCOUNTER — Other Ambulatory Visit (HOSPITAL_COMMUNITY): Payer: Self-pay

## 2023-07-09 DIAGNOSIS — I10 Essential (primary) hypertension: Secondary | ICD-10-CM

## 2023-07-09 DIAGNOSIS — Z951 Presence of aortocoronary bypass graft: Secondary | ICD-10-CM | POA: Insufficient documentation

## 2023-07-09 DIAGNOSIS — I214 Non-ST elevation (NSTEMI) myocardial infarction: Secondary | ICD-10-CM

## 2023-07-09 DIAGNOSIS — F17210 Nicotine dependence, cigarettes, uncomplicated: Secondary | ICD-10-CM

## 2023-07-09 DIAGNOSIS — I252 Old myocardial infarction: Secondary | ICD-10-CM

## 2023-07-09 DIAGNOSIS — I2510 Atherosclerotic heart disease of native coronary artery without angina pectoris: Secondary | ICD-10-CM

## 2023-07-09 HISTORY — PX: CORONARY ARTERY BYPASS GRAFT: SHX141

## 2023-07-09 HISTORY — DX: Presence of aortocoronary bypass graft: Z95.1

## 2023-07-09 HISTORY — PX: TEE WITHOUT CARDIOVERSION: SHX5443

## 2023-07-09 LAB — POCT I-STAT, CHEM 8
BUN: 12 mg/dL (ref 6–20)
BUN: 11 mg/dL (ref 6–20)
BUN: 10 mg/dL (ref 6–20)
BUN: 11 mg/dL (ref 6–20)
Calcium, Ion: 1.28 mmol/L (ref 1.15–1.40)
Calcium, Ion: 1.24 mmol/L (ref 1.15–1.40)
Calcium, Ion: 1.09 mmol/L — ABNORMAL LOW (ref 1.15–1.40)
Calcium, Ion: 1.48 mmol/L — ABNORMAL HIGH (ref 1.15–1.40)
Chloride: 106 mmol/L (ref 98–111)
Chloride: 105 mmol/L (ref 98–111)
Chloride: 107 mmol/L (ref 98–111)
Chloride: 108 mmol/L (ref 98–111)
Creatinine, Ser: 0.5 mg/dL (ref 0.44–1.00)
Creatinine, Ser: 0.5 mg/dL (ref 0.44–1.00)
Creatinine, Ser: 0.5 mg/dL (ref 0.44–1.00)
Creatinine, Ser: 0.6 mg/dL (ref 0.44–1.00)
Glucose, Bld: 110 mg/dL — ABNORMAL HIGH (ref 70–99)
Glucose, Bld: 114 mg/dL — ABNORMAL HIGH (ref 70–99)
Glucose, Bld: 97 mg/dL (ref 70–99)
Glucose, Bld: 106 mg/dL — ABNORMAL HIGH (ref 70–99)
HCT: 36 % (ref 36.0–46.0)
HCT: 34 % — ABNORMAL LOW (ref 36.0–46.0)
HCT: 26 % — ABNORMAL LOW (ref 36.0–46.0)
HCT: 28 % — ABNORMAL LOW (ref 36.0–46.0)
Hemoglobin: 12.2 g/dL (ref 12.0–15.0)
Hemoglobin: 11.6 g/dL — ABNORMAL LOW (ref 12.0–15.0)
Hemoglobin: 8.8 g/dL — ABNORMAL LOW (ref 12.0–15.0)
Hemoglobin: 9.5 g/dL — ABNORMAL LOW (ref 12.0–15.0)
Potassium: 4.7 mmol/L (ref 3.5–5.1)
Potassium: 5.1 mmol/L (ref 3.5–5.1)
Potassium: 5.5 mmol/L — ABNORMAL HIGH (ref 3.5–5.1)
Potassium: 5.9 mmol/L — ABNORMAL HIGH (ref 3.5–5.1)
Sodium: 139 mmol/L (ref 135–145)
Sodium: 139 mmol/L (ref 135–145)
Sodium: 138 mmol/L (ref 135–145)
Sodium: 136 mmol/L (ref 135–145)
TCO2: 26 mmol/L (ref 22–32)
TCO2: 29 mmol/L (ref 22–32)
TCO2: 27 mmol/L (ref 22–32)
TCO2: 25 mmol/L (ref 22–32)

## 2023-07-09 LAB — POCT I-STAT 7, (LYTES, BLD GAS, ICA,H+H)
Acid-Base Excess: 1 mmol/L (ref 0.0–2.0)
Acid-Base Excess: 1 mmol/L (ref 0.0–2.0)
Acid-Base Excess: 0 mmol/L (ref 0.0–2.0)
Acid-base deficit: 1 mmol/L (ref 0.0–2.0)
Acid-base deficit: 1 mmol/L (ref 0.0–2.0)
Acid-base deficit: 2 mmol/L (ref 0.0–2.0)
Acid-base deficit: 2 mmol/L (ref 0.0–2.0)
Bicarbonate: 26.4 mmol/L (ref 20.0–28.0)
Bicarbonate: 22.7 mmol/L (ref 20.0–28.0)
Bicarbonate: 25 mmol/L (ref 20.0–28.0)
Bicarbonate: 24.8 mmol/L (ref 20.0–28.0)
Bicarbonate: 25.8 mmol/L (ref 20.0–28.0)
Bicarbonate: 25.1 mmol/L (ref 20.0–28.0)
Bicarbonate: 25.3 mmol/L (ref 20.0–28.0)
Calcium, Ion: 1.24 mmol/L (ref 1.15–1.40)
Calcium, Ion: 1.07 mmol/L — ABNORMAL LOW (ref 1.15–1.40)
Calcium, Ion: 1.44 mmol/L — ABNORMAL HIGH (ref 1.15–1.40)
Calcium, Ion: 1.4 mmol/L (ref 1.15–1.40)
Calcium, Ion: 1.3 mmol/L (ref 1.15–1.40)
Calcium, Ion: 1.33 mmol/L (ref 1.15–1.40)
Calcium, Ion: 1.28 mmol/L (ref 1.15–1.40)
HCT: 34 % — ABNORMAL LOW (ref 36.0–46.0)
HCT: 27 % — ABNORMAL LOW (ref 36.0–46.0)
HCT: 27 % — ABNORMAL LOW (ref 36.0–46.0)
HCT: 27 % — ABNORMAL LOW (ref 36.0–46.0)
HCT: 28 % — ABNORMAL LOW (ref 36.0–46.0)
HCT: 30 % — ABNORMAL LOW (ref 36.0–46.0)
HCT: 33 % — ABNORMAL LOW (ref 36.0–46.0)
Hemoglobin: 11.6 g/dL — ABNORMAL LOW (ref 12.0–15.0)
Hemoglobin: 9.2 g/dL — ABNORMAL LOW (ref 12.0–15.0)
Hemoglobin: 9.2 g/dL — ABNORMAL LOW (ref 12.0–15.0)
Hemoglobin: 9.2 g/dL — ABNORMAL LOW (ref 12.0–15.0)
Hemoglobin: 9.5 g/dL — ABNORMAL LOW (ref 12.0–15.0)
Hemoglobin: 10.2 g/dL — ABNORMAL LOW (ref 12.0–15.0)
Hemoglobin: 11.2 g/dL — ABNORMAL LOW (ref 12.0–15.0)
O2 Saturation: 100 %
O2 Saturation: 100 %
O2 Saturation: 100 %
O2 Saturation: 99 %
O2 Saturation: 98 %
O2 Saturation: 99 %
O2 Saturation: 98 %
Patient temperature: 35.9
Patient temperature: 37.1
Patient temperature: 37.6
Patient temperature: 98.6
Potassium: 4.7 mmol/L (ref 3.5–5.1)
Potassium: 5.5 mmol/L — ABNORMAL HIGH (ref 3.5–5.1)
Potassium: 5.9 mmol/L — ABNORMAL HIGH (ref 3.5–5.1)
Potassium: 5.1 mmol/L (ref 3.5–5.1)
Potassium: 4.5 mmol/L (ref 3.5–5.1)
Potassium: 4.7 mmol/L (ref 3.5–5.1)
Potassium: 5.2 mmol/L — ABNORMAL HIGH (ref 3.5–5.1)
Sodium: 139 mmol/L (ref 135–145)
Sodium: 138 mmol/L (ref 135–145)
Sodium: 137 mmol/L (ref 135–145)
Sodium: 138 mmol/L (ref 135–145)
Sodium: 140 mmol/L (ref 135–145)
Sodium: 140 mmol/L (ref 135–145)
Sodium: 139 mmol/L (ref 135–145)
TCO2: 28 mmol/L (ref 22–32)
TCO2: 24 mmol/L (ref 22–32)
TCO2: 26 mmol/L (ref 22–32)
TCO2: 26 mmol/L (ref 22–32)
TCO2: 27 mmol/L (ref 22–32)
TCO2: 27 mmol/L (ref 22–32)
TCO2: 27 mmol/L (ref 22–32)
pCO2 arterial: 42.1 mmHg (ref 32–48)
pCO2 arterial: 31.2 mmHg — ABNORMAL LOW (ref 32–48)
pCO2 arterial: 38.4 mmHg (ref 32–48)
pCO2 arterial: 40.1 mmHg (ref 32–48)
pCO2 arterial: 53.9 mmHg — ABNORMAL HIGH (ref 32–48)
pCO2 arterial: 56.1 mmHg — ABNORMAL HIGH (ref 32–48)
pCO2 arterial: 55.6 mmHg — ABNORMAL HIGH (ref 32–48)
pH, Arterial: 7.406 (ref 7.35–7.45)
pH, Arterial: 7.47 — ABNORMAL HIGH (ref 7.35–7.45)
pH, Arterial: 7.421 (ref 7.35–7.45)
pH, Arterial: 7.394 (ref 7.35–7.45)
pH, Arterial: 7.288 — ABNORMAL LOW (ref 7.35–7.45)
pH, Arterial: 7.262 — ABNORMAL LOW (ref 7.35–7.45)
pH, Arterial: 7.265 — ABNORMAL LOW (ref 7.35–7.45)
pO2, Arterial: 311 mmHg — ABNORMAL HIGH (ref 83–108)
pO2, Arterial: 349 mmHg — ABNORMAL HIGH (ref 83–108)
pO2, Arterial: 185 mmHg — ABNORMAL HIGH (ref 83–108)
pO2, Arterial: 122 mmHg — ABNORMAL HIGH (ref 83–108)
pO2, Arterial: 114 mmHg — ABNORMAL HIGH (ref 83–108)
pO2, Arterial: 137 mmHg — ABNORMAL HIGH (ref 83–108)
pO2, Arterial: 121 mmHg — ABNORMAL HIGH (ref 83–108)

## 2023-07-09 LAB — BASIC METABOLIC PANEL
Anion gap: 10 (ref 5–15)
Anion gap: 8 (ref 5–15)
BUN: 13 mg/dL (ref 6–20)
BUN: 10 mg/dL (ref 6–20)
CO2: 25 mmol/L (ref 22–32)
CO2: 26 mmol/L (ref 22–32)
Calcium: 9.5 mg/dL (ref 8.9–10.3)
Calcium: 9 mg/dL (ref 8.9–10.3)
Chloride: 104 mmol/L (ref 98–111)
Chloride: 106 mmol/L (ref 98–111)
Creatinine, Ser: 0.62 mg/dL (ref 0.44–1.00)
Creatinine, Ser: 0.76 mg/dL (ref 0.44–1.00)
GFR, Estimated: >60 mL/min (ref >60)
GFR, Estimated: >60 mL/min (ref >60)
Glucose, Bld: 124 mg/dL — ABNORMAL HIGH (ref 70–99)
Glucose, Bld: 116 mg/dL — ABNORMAL HIGH (ref 70–99)
Potassium: 3.9 mmol/L (ref 3.5–5.1)
Potassium: 4.8 mmol/L (ref 3.5–5.1)
Sodium: 139 mmol/L (ref 135–145)
Sodium: 140 mmol/L (ref 135–145)

## 2023-07-09 LAB — CBC
HCT: 38.3 % (ref 36.0–46.0)
HCT: 29.5 % — ABNORMAL LOW (ref 36.0–46.0)
HCT: 31.7 % — ABNORMAL LOW (ref 36.0–46.0)
Hemoglobin: 12.7 g/dL (ref 12.0–15.0)
Hemoglobin: 9.6 g/dL — ABNORMAL LOW (ref 12.0–15.0)
Hemoglobin: 10.5 g/dL — ABNORMAL LOW (ref 12.0–15.0)
MCH: 32.7 pg (ref 26.0–34.0)
MCH: 32.2 pg (ref 26.0–34.0)
MCH: 33.7 pg (ref 26.0–34.0)
MCHC: 33.2 g/dL (ref 30.0–36.0)
MCHC: 32.5 g/dL (ref 30.0–36.0)
MCHC: 33.1 g/dL (ref 30.0–36.0)
MCV: 98.7 fL (ref 80.0–100.0)
MCV: 99 fL (ref 80.0–100.0)
MCV: 101.6 fL — ABNORMAL HIGH (ref 80.0–100.0)
Platelets: 135 10*3/uL — ABNORMAL LOW (ref 150–400)
Platelets: 90 10*3/uL — ABNORMAL LOW (ref 150–400)
Platelets: 103 10*3/uL — ABNORMAL LOW (ref 150–400)
RBC: 3.88 MIL/uL (ref 3.87–5.11)
RBC: 2.98 MIL/uL — ABNORMAL LOW (ref 3.87–5.11)
RBC: 3.12 MIL/uL — ABNORMAL LOW (ref 3.87–5.11)
RDW: 12.3 % (ref 11.5–15.5)
RDW: 12.6 % (ref 11.5–15.5)
RDW: 12.8 % (ref 11.5–15.5)
WBC: 4.4 10*3/uL (ref 4.0–10.5)
WBC: 5.8 10*3/uL (ref 4.0–10.5)
WBC: 6.7 10*3/uL (ref 4.0–10.5)
nRBC: 0 % (ref 0.0–0.2)
nRBC: 0 % (ref 0.0–0.2)
nRBC: 0 % (ref 0.0–0.2)

## 2023-07-09 LAB — HEPARIN LEVEL (UNFRACTIONATED): Heparin Unfractionated: 0.45 IU/mL (ref 0.30–0.70)

## 2023-07-09 LAB — HEMOGLOBIN AND HEMATOCRIT, BLOOD
HCT: 26.9 % — ABNORMAL LOW (ref 36.0–46.0)
Hemoglobin: 8.7 g/dL — ABNORMAL LOW (ref 12.0–15.0)

## 2023-07-09 LAB — GLUCOSE, CAPILLARY
Glucose-Capillary: 102 mg/dL — ABNORMAL HIGH (ref 70–99)
Glucose-Capillary: 112 mg/dL — ABNORMAL HIGH (ref 70–99)
Glucose-Capillary: 144 mg/dL — ABNORMAL HIGH (ref 70–99)
Glucose-Capillary: 65 mg/dL — ABNORMAL LOW (ref 70–99)
Glucose-Capillary: 105 mg/dL — ABNORMAL HIGH (ref 70–99)
Glucose-Capillary: 105 mg/dL — ABNORMAL HIGH (ref 70–99)
Glucose-Capillary: 100 mg/dL — ABNORMAL HIGH (ref 70–99)
Glucose-Capillary: 123 mg/dL — ABNORMAL HIGH (ref 70–99)
Glucose-Capillary: 122 mg/dL — ABNORMAL HIGH (ref 70–99)

## 2023-07-09 LAB — PROTIME-INR
INR: 1.3 — ABNORMAL HIGH (ref 0.8–1.2)
Prothrombin Time: 16.5 seconds — ABNORMAL HIGH (ref 11.4–15.2)

## 2023-07-09 LAB — PLATELET COUNT: Platelets: 100 10*3/uL — ABNORMAL LOW (ref 150–400)

## 2023-07-09 LAB — ECHO INTRAOPERATIVE TEE
Height: 64 in
S' Lateral: 2.7 cm
Weight: 2127.99 oz

## 2023-07-09 LAB — MAGNESIUM: Magnesium: 3.2 mg/dL — ABNORMAL HIGH (ref 1.7–2.4)

## 2023-07-09 LAB — LIPOPROTEIN A (LPA): Lipoprotein (a): 267.1 nmol/L — ABNORMAL HIGH (ref <75.0)

## 2023-07-09 LAB — APTT: aPTT: 30 seconds (ref 24–36)

## 2023-07-09 SURGERY — CORONARY ARTERY BYPASS GRAFTING (CABG)
Anesthesia: General | Site: Chest

## 2023-07-09 MED ORDER — METOCLOPRAMIDE HCL 5 MG/ML IJ SOLN
10.0000 mg | Freq: Four times a day (QID) | INTRAMUSCULAR | Status: AC
  Administered 2023-07-09 – 2023-07-10 (×6): 10 mg via INTRAVENOUS
  Filled 2023-07-09 (×6): qty 2

## 2023-07-09 MED ORDER — DOCUSATE SODIUM 100 MG PO CAPS
200.0000 mg | ORAL_CAPSULE | Freq: Every day | ORAL | Status: DC
  Administered 2023-07-10 – 2023-07-13 (×4): 200 mg via ORAL
  Filled 2023-07-09 (×4): qty 2

## 2023-07-09 MED ORDER — PLASMA-LYTE A IV SOLN: INTRAVENOUS | Status: DC | PRN

## 2023-07-09 MED ORDER — NITROGLYCERIN IN D5W 200-5 MCG/ML-% IV SOLN: 0.0000 ug/min | INTRAVENOUS | Status: DC

## 2023-07-09 MED ORDER — ONDANSETRON HCL 4 MG/2ML IJ SOLN
4.0000 mg | Freq: Four times a day (QID) | INTRAMUSCULAR | Status: DC | PRN
  Administered 2023-07-09 – 2023-07-10 (×2): 4 mg via INTRAVENOUS
  Filled 2023-07-09 (×2): qty 2

## 2023-07-09 MED ORDER — CHLORHEXIDINE GLUCONATE 0.12 % MT SOLN
15.0000 mL | OROMUCOSAL | Status: AC
  Administered 2023-07-09: 15 mL via OROMUCOSAL
  Filled 2023-07-09: qty 15

## 2023-07-09 MED ORDER — LACTATED RINGERS IV SOLN: INTRAVENOUS | Status: DC | PRN

## 2023-07-09 MED ORDER — FENTANYL CITRATE (PF) 250 MCG/5ML IJ SOLN
INTRAMUSCULAR | Status: AC
  Filled 2023-07-09: qty 5

## 2023-07-09 MED ORDER — FENTANYL CITRATE (PF) 250 MCG/5ML IJ SOLN
INTRAMUSCULAR | Status: DC | PRN
  Administered 2023-07-09: 50 ug via INTRAVENOUS
  Administered 2023-07-09: 100 ug via INTRAVENOUS
  Administered 2023-07-09: 200 ug via INTRAVENOUS
  Administered 2023-07-09: 100 ug via INTRAVENOUS
  Administered 2023-07-09 (×2): 150 ug via INTRAVENOUS
  Administered 2023-07-09: 100 ug via INTRAVENOUS
  Administered 2023-07-09: 50 ug via INTRAVENOUS
  Administered 2023-07-09: 100 ug via INTRAVENOUS
  Administered 2023-07-09: 200 ug via INTRAVENOUS
  Administered 2023-07-09 (×2): 100 ug via INTRAVENOUS

## 2023-07-09 MED ORDER — MORPHINE SULFATE (PF) 2 MG/ML IV SOLN
1.0000 mg | INTRAVENOUS | Status: DC | PRN
  Administered 2023-07-09: 2 mg via INTRAVENOUS
  Administered 2023-07-09: 4 mg via INTRAVENOUS
  Administered 2023-07-09 (×3): 2 mg via INTRAVENOUS
  Administered 2023-07-09 – 2023-07-10 (×2): 4 mg via INTRAVENOUS
  Filled 2023-07-09 (×3): qty 2
  Filled 2023-07-09 (×4): qty 1

## 2023-07-09 MED ORDER — PHENYLEPHRINE 80 MCG/ML (10ML) SYRINGE FOR IV PUSH (FOR BLOOD PRESSURE SUPPORT)
PREFILLED_SYRINGE | INTRAVENOUS | Status: DC | PRN
  Administered 2023-07-09: 80 ug via INTRAVENOUS

## 2023-07-09 MED ORDER — ASPIRIN 81 MG PO CHEW: 324.0000 mg | CHEWABLE_TABLET | Freq: Every day | ORAL | Status: DC

## 2023-07-09 MED ORDER — SODIUM CHLORIDE 0.9% FLUSH
10.0000 mL | Freq: Two times a day (BID) | INTRAVENOUS | Status: DC
  Administered 2023-07-09 – 2023-07-11 (×4): 10 mL

## 2023-07-09 MED ORDER — PANTOPRAZOLE SODIUM 40 MG IV SOLR
40.0000 mg | Freq: Every day | INTRAVENOUS | Status: AC
  Administered 2023-07-09 – 2023-07-10 (×2): 40 mg via INTRAVENOUS
  Filled 2023-07-09 (×2): qty 10

## 2023-07-09 MED ORDER — PHENYLEPHRINE HCL-NACL 20-0.9 MG/250ML-% IV SOLN: 0.0000 ug/min | INTRAVENOUS | Status: DC

## 2023-07-09 MED ORDER — METOPROLOL TARTRATE 12.5 MG HALF TABLET
12.5000 mg | ORAL_TABLET | Freq: Two times a day (BID) | ORAL | Status: DC
  Administered 2023-07-09 – 2023-07-12 (×6): 12.5 mg via ORAL
  Filled 2023-07-09 (×6): qty 1

## 2023-07-09 MED ORDER — LACTATED RINGERS IV SOLN: INTRAVENOUS | Status: DC

## 2023-07-09 MED ORDER — SODIUM CHLORIDE (PF) 0.9 % IJ SOLN: OROMUCOSAL | Status: DC | PRN

## 2023-07-09 MED ORDER — EPHEDRINE SULFATE-NACL 50-0.9 MG/10ML-% IV SOSY
PREFILLED_SYRINGE | INTRAVENOUS | Status: DC | PRN
  Administered 2023-07-09: 2.5 mg via INTRAVENOUS

## 2023-07-09 MED ORDER — CHLORHEXIDINE GLUCONATE CLOTH 2 % EX PADS
6.0000 | MEDICATED_PAD | Freq: Every day | CUTANEOUS | Status: DC
  Administered 2023-07-09 – 2023-07-11 (×4): 6 via TOPICAL

## 2023-07-09 MED ORDER — ORAL CARE MOUTH RINSE: 15.0000 mL | OROMUCOSAL | Status: DC | PRN

## 2023-07-09 MED ORDER — ASPIRIN 81 MG PO CHEW
324.0000 mg | CHEWABLE_TABLET | Freq: Once | ORAL | Status: AC
  Administered 2023-07-09: 324 mg via ORAL
  Filled 2023-07-09: qty 4

## 2023-07-09 MED ORDER — TRAMADOL HCL 50 MG PO TABS
50.0000 mg | ORAL_TABLET | ORAL | Status: DC | PRN
  Administered 2023-07-09 – 2023-07-10 (×3): 100 mg via ORAL
  Filled 2023-07-09 (×3): qty 2

## 2023-07-09 MED ORDER — ASPIRIN 325 MG PO TBEC
325.0000 mg | DELAYED_RELEASE_TABLET | Freq: Every day | ORAL | Status: DC
  Administered 2023-07-10: 325 mg via ORAL
  Filled 2023-07-09: qty 1

## 2023-07-09 MED ORDER — MAGNESIUM SULFATE 4 GM/100ML IV SOLN
4.0000 g | Freq: Once | INTRAVENOUS | Status: AC
  Administered 2023-07-09: 4 g via INTRAVENOUS
  Filled 2023-07-09: qty 100

## 2023-07-09 MED ORDER — SODIUM CHLORIDE 0.9% FLUSH: 10.0000 mL | INTRAVENOUS | Status: DC | PRN

## 2023-07-09 MED ORDER — DEXTROSE 50 % IV SOLN
0.0000 mL | INTRAVENOUS | Status: DC | PRN
  Administered 2023-07-09: 20 mL via INTRAVENOUS
  Filled 2023-07-09: qty 50

## 2023-07-09 MED ORDER — ALBUMIN HUMAN 5 % IV SOLN: INTRAVENOUS | Status: DC | PRN

## 2023-07-09 MED ORDER — BISACODYL 10 MG RE SUPP: 10.0000 mg | Freq: Every day | RECTAL | Status: DC

## 2023-07-09 MED ORDER — DEXMEDETOMIDINE HCL IN NACL 400 MCG/100ML IV SOLN: 0.0000 ug/kg/h | INTRAVENOUS | Status: DC

## 2023-07-09 MED ORDER — CEFAZOLIN SODIUM-DEXTROSE 2-4 GM/100ML-% IV SOLN
2.0000 g | Freq: Three times a day (TID) | INTRAVENOUS | Status: AC
  Administered 2023-07-09 – 2023-07-11 (×6): 2 g via INTRAVENOUS
  Filled 2023-07-09 (×6): qty 100

## 2023-07-09 MED ORDER — INSULIN REGULAR(HUMAN) IN NACL 100-0.9 UT/100ML-% IV SOLN: INTRAVENOUS | Status: DC

## 2023-07-09 MED ORDER — INSULIN ASPART 100 UNIT/ML IJ SOLN
0.0000 [IU] | INTRAMUSCULAR | Status: DC
  Administered 2023-07-09 – 2023-07-11 (×6): 1 [IU] via SUBCUTANEOUS

## 2023-07-09 MED ORDER — SODIUM CHLORIDE 0.9% FLUSH
3.0000 mL | Freq: Two times a day (BID) | INTRAVENOUS | Status: DC
  Administered 2023-07-10 – 2023-07-11 (×3): 3 mL via INTRAVENOUS

## 2023-07-09 MED ORDER — ACETAMINOPHEN 160 MG/5ML PO SOLN: 1000.0000 mg | Freq: Four times a day (QID) | ORAL | Status: DC

## 2023-07-09 MED ORDER — METOPROLOL TARTRATE 5 MG/5ML IV SOLN
2.5000 mg | INTRAVENOUS | Status: DC | PRN
  Administered 2023-07-10 (×2): 2.5 mg via INTRAVENOUS
  Filled 2023-07-09 (×2): qty 5

## 2023-07-09 MED ORDER — METOPROLOL TARTRATE 25 MG/10 ML ORAL SUSPENSION: 12.5000 mg | Freq: Two times a day (BID) | ORAL | Status: DC

## 2023-07-09 MED ORDER — OXYCODONE HCL 5 MG PO TABS
5.0000 mg | ORAL_TABLET | ORAL | Status: DC | PRN
  Administered 2023-07-09 – 2023-07-10 (×7): 10 mg via ORAL
  Administered 2023-07-11 (×2): 5 mg via ORAL
  Administered 2023-07-12 – 2023-07-13 (×4): 10 mg via ORAL
  Filled 2023-07-09 (×9): qty 2
  Filled 2023-07-09: qty 1
  Filled 2023-07-09 (×2): qty 2
  Filled 2023-07-09: qty 1
  Filled 2023-07-09 (×2): qty 2

## 2023-07-09 MED ORDER — MIDAZOLAM HCL 2 MG/2ML IJ SOLN
2.0000 mg | INTRAMUSCULAR | Status: DC | PRN
  Administered 2023-07-09: 2 mg via INTRAVENOUS
  Filled 2023-07-09: qty 2

## 2023-07-09 MED ORDER — PROPOFOL 10 MG/ML IV BOLUS
INTRAVENOUS | Status: DC | PRN
  Administered 2023-07-09: 80 mg via INTRAVENOUS
  Administered 2023-07-09: 30 mg via INTRAVENOUS

## 2023-07-09 MED ORDER — SODIUM CHLORIDE 0.9 % IV SOLN: 250.0000 mL | INTRAVENOUS | Status: DC

## 2023-07-09 MED ORDER — GLYCOPYRROLATE 0.2 MG/ML IJ SOLN
INTRAMUSCULAR | Status: DC | PRN
  Administered 2023-07-09: .2 mg via INTRAVENOUS

## 2023-07-09 MED ORDER — ACETAMINOPHEN 160 MG/5ML PO SOLN: 650.0000 mg | Freq: Once | ORAL | Status: AC

## 2023-07-09 MED ORDER — POTASSIUM CHLORIDE 10 MEQ/50ML IV SOLN: 10.0000 meq | INTRAVENOUS | Status: AC

## 2023-07-09 MED ORDER — SODIUM CHLORIDE 0.9 % IV SOLN: INTRAVENOUS | Status: DC

## 2023-07-09 MED ORDER — PROPOFOL 10 MG/ML IV BOLUS
INTRAVENOUS | Status: AC
  Filled 2023-07-09: qty 20

## 2023-07-09 MED ORDER — ROSUVASTATIN CALCIUM 20 MG PO TABS
20.0000 mg | ORAL_TABLET | Freq: Every day | ORAL | Status: DC
  Administered 2023-07-10 – 2023-07-13 (×4): 20 mg via ORAL
  Filled 2023-07-09 (×4): qty 1

## 2023-07-09 MED ORDER — ALBUMIN HUMAN 5 % IV SOLN
250.0000 mL | INTRAVENOUS | Status: DC | PRN
  Administered 2023-07-09 (×2): 12.5 g via INTRAVENOUS

## 2023-07-09 MED ORDER — SODIUM CHLORIDE 0.9% FLUSH: 3.0000 mL | INTRAVENOUS | Status: DC | PRN

## 2023-07-09 MED ORDER — ACETAMINOPHEN 650 MG RE SUPP
650.0000 mg | Freq: Once | RECTAL | Status: AC
  Administered 2023-07-09: 650 mg via RECTAL

## 2023-07-09 MED ORDER — ORAL CARE MOUTH RINSE
15.0000 mL | OROMUCOSAL | Status: DC
  Administered 2023-07-09 (×2): 15 mL via OROMUCOSAL

## 2023-07-09 MED ORDER — PROTAMINE SULFATE 10 MG/ML IV SOLN
INTRAVENOUS | Status: DC | PRN
  Administered 2023-07-09: 210 mg via INTRAVENOUS

## 2023-07-09 MED ORDER — ROCURONIUM BROMIDE 10 MG/ML (PF) SYRINGE
PREFILLED_SYRINGE | INTRAVENOUS | Status: DC | PRN
  Administered 2023-07-09: 50 mg via INTRAVENOUS
  Administered 2023-07-09: 30 mg via INTRAVENOUS
  Administered 2023-07-09: 70 mg via INTRAVENOUS
  Administered 2023-07-09: 50 mg via INTRAVENOUS

## 2023-07-09 MED ORDER — SODIUM CHLORIDE 0.45 % IV SOLN: INTRAVENOUS | Status: DC | PRN

## 2023-07-09 MED ORDER — BISACODYL 5 MG PO TBEC
10.0000 mg | DELAYED_RELEASE_TABLET | Freq: Every day | ORAL | Status: DC
  Administered 2023-07-10 – 2023-07-13 (×4): 10 mg via ORAL
  Filled 2023-07-09 (×4): qty 2

## 2023-07-09 MED ORDER — MIDAZOLAM HCL (PF) 10 MG/2ML IJ SOLN
INTRAMUSCULAR | Status: AC
  Filled 2023-07-09: qty 2

## 2023-07-09 MED ORDER — PANTOPRAZOLE SODIUM 40 MG PO TBEC
40.0000 mg | DELAYED_RELEASE_TABLET | Freq: Every day | ORAL | Status: DC
  Administered 2023-07-11 – 2023-07-13 (×3): 40 mg via ORAL
  Filled 2023-07-09 (×3): qty 1

## 2023-07-09 MED ORDER — MIDAZOLAM HCL (PF) 5 MG/ML IJ SOLN
INTRAMUSCULAR | Status: DC | PRN
  Administered 2023-07-09: 1 mg via INTRAVENOUS
  Administered 2023-07-09: 3 mg via INTRAVENOUS
  Administered 2023-07-09 (×2): 2 mg via INTRAVENOUS

## 2023-07-09 MED ORDER — CHLORHEXIDINE GLUCONATE CLOTH 2 % EX PADS: 6.0000 | MEDICATED_PAD | Freq: Every day | CUTANEOUS | Status: DC

## 2023-07-09 MED ORDER — HEPARIN SODIUM (PORCINE) 1000 UNIT/ML IJ SOLN
INTRAMUSCULAR | Status: DC | PRN
  Administered 2023-07-09: 21000 [IU] via INTRAVENOUS

## 2023-07-09 MED ORDER — ACETAMINOPHEN 500 MG PO TABS
1000.0000 mg | ORAL_TABLET | Freq: Four times a day (QID) | ORAL | Status: DC
  Administered 2023-07-10 – 2023-07-13 (×9): 1000 mg via ORAL
  Filled 2023-07-09 (×9): qty 2

## 2023-07-09 MED ORDER — VANCOMYCIN HCL IN DEXTROSE 1-5 GM/200ML-% IV SOLN
1000.0000 mg | Freq: Once | INTRAVENOUS | Status: AC
  Administered 2023-07-09: 1000 mg via INTRAVENOUS
  Filled 2023-07-09: qty 200

## 2023-07-09 SURGICAL SUPPLY — 91 items
ADH SKN CLS APL DERMABOND .7 (GAUZE/BANDAGES/DRESSINGS) ×2
ANTIFOG SOL W/FOAM PAD STRL (MISCELLANEOUS) ×2
BAG DECANTER FOR FLEXI CONT (MISCELLANEOUS) ×2 IMPLANT
BLADE CLIPPER SURG (BLADE) ×2 IMPLANT
BLADE STERNUM SYSTEM 6 (BLADE) ×2 IMPLANT
BLADE SURG 11 STRL SS (BLADE) IMPLANT
BNDG CMPR 5X3 KNIT ELC UNQ LF (GAUZE/BANDAGES/DRESSINGS) ×2
BNDG CMPR 5X62 HK CLSR LF (GAUZE/BANDAGES/DRESSINGS) ×2
BNDG CMPR 6 X 5 YARDS HK CLSR (GAUZE/BANDAGES/DRESSINGS) ×2
BNDG CMPR 6"X 5 YARDS HK CLSR (GAUZE/BANDAGES/DRESSINGS) ×2
BNDG ELASTIC 3INX 5YD STR LF (GAUZE/BANDAGES/DRESSINGS) IMPLANT
BNDG ELASTIC 6INX 5YD STR LF (GAUZE/BANDAGES/DRESSINGS) IMPLANT
BNDG GAUZE DERMACEA FLUFF 4 (GAUZE/BANDAGES/DRESSINGS) ×2 IMPLANT
BNDG GZE DERMACEA 4 6PLY (GAUZE/BANDAGES/DRESSINGS) ×2
CANISTER SUCT 3000ML PPV (MISCELLANEOUS) ×2 IMPLANT
CANNULA MC2 2 STG 29/37 NON-V (CANNULA) ×2 IMPLANT
CANNULA NON VENT 20FR 12 (CANNULA) ×2 IMPLANT
CATH ROBINSON RED A/P 18FR (CATHETERS) ×4 IMPLANT
CLIP RETRACTION 3.0MM CORONARY (MISCELLANEOUS) IMPLANT
CLIP TI MEDIUM 24 (CLIP) IMPLANT
CLIP TI WIDE RED SMALL 24 (CLIP) IMPLANT
CONN ST 1/2X1/2 BEN (MISCELLANEOUS) ×2 IMPLANT
CONNECTOR BLAKE 2:1 CARIO BLK (MISCELLANEOUS) ×2 IMPLANT
CONTAINER PROTECT SURGISLUSH (MISCELLANEOUS) ×4 IMPLANT
COVER PROBE W GEL 5X96 (DRAPES) IMPLANT
DERMABOND ADVANCED .7 DNX12 (GAUZE/BANDAGES/DRESSINGS) IMPLANT
DRAIN CHANNEL 19F RND (DRAIN) ×6 IMPLANT
DRAIN CONNECTOR BLAKE 1:1 (MISCELLANEOUS) ×2 IMPLANT
DRAPE CARDIOVASCULAR INCISE (DRAPES) ×2
DRAPE INCISE IOBAN 66X45 STRL (DRAPES) IMPLANT
DRAPE SRG 135X102X78XABS (DRAPES) ×2 IMPLANT
DRAPE WARM FLUID 44X44 (DRAPES) ×2 IMPLANT
DRSG AQUACEL AG ADV 3.5X10 (GAUZE/BANDAGES/DRESSINGS) ×2 IMPLANT
ELECT BLADE 4.0 EZ CLEAN MEGAD (MISCELLANEOUS) ×2
ELECT REM PT RETURN 9FT ADLT (ELECTROSURGICAL) ×4
ELECTRODE BLDE 4.0 EZ CLN MEGD (MISCELLANEOUS) ×2 IMPLANT
ELECTRODE REM PT RTRN 9FT ADLT (ELECTROSURGICAL) ×4 IMPLANT
FELT TEFLON 1X6 (MISCELLANEOUS) ×4 IMPLANT
GAUZE 4X4 16PLY ~~LOC~~+RFID DBL (SPONGE) ×2 IMPLANT
GAUZE SPONGE 4X4 12PLY STRL (GAUZE/BANDAGES/DRESSINGS) ×4 IMPLANT
GLOVE BIO SURGEON STRL SZ7 (GLOVE) ×4 IMPLANT
GLOVE BIOGEL M STRL SZ7.5 (GLOVE) ×4 IMPLANT
GOWN STRL REUS W/ TWL LRG LVL3 (GOWN DISPOSABLE) ×8 IMPLANT
GOWN STRL REUS W/ TWL XL LVL3 (GOWN DISPOSABLE) ×4 IMPLANT
GOWN STRL REUS W/TWL LRG LVL3 (GOWN DISPOSABLE) ×10
GOWN STRL REUS W/TWL XL LVL3 (GOWN DISPOSABLE) ×2
HEMOSTAT POWDER SURGIFOAM 1G (HEMOSTASIS) ×4 IMPLANT
INSERT SUTURE HOLDER (MISCELLANEOUS) ×2 IMPLANT
KIT BASIN OR (CUSTOM PROCEDURE TRAY) ×2 IMPLANT
KIT SUCTION CATH 14FR (SUCTIONS) ×2 IMPLANT
KIT TURNOVER KIT B (KITS) ×2 IMPLANT
KIT VASOVIEW HEMOPRO 2 VH 4000 (KITS) ×2 IMPLANT
LEAD PACING MYOCARDI (MISCELLANEOUS) ×2 IMPLANT
MARKER GRAFT CORONARY BYPASS (MISCELLANEOUS) ×6 IMPLANT
NS IRRIG 1000ML POUR BTL (IV SOLUTION) ×10 IMPLANT
PACK OPEN HEART (CUSTOM PROCEDURE TRAY) ×2 IMPLANT
PAD ARMBOARD 7.5X6 YLW CONV (MISCELLANEOUS) ×4 IMPLANT
PAD ELECT DEFIB RADIOL ZOLL (MISCELLANEOUS) ×2 IMPLANT
PENCIL BUTTON HOLSTER BLD 10FT (ELECTRODE) ×2 IMPLANT
POSITIONER HEAD DONUT 9IN (MISCELLANEOUS) ×2 IMPLANT
PUNCH AORTIC ROTATE 4.0MM (MISCELLANEOUS) ×2 IMPLANT
SET MPS 3-ND DEL (MISCELLANEOUS) IMPLANT
SOLUTION ANTFG W/FOAM PAD STRL (MISCELLANEOUS) IMPLANT
SPONGE T-LAP 18X18 ~~LOC~~+RFID (SPONGE) ×8 IMPLANT
SUPPORT HEART JANKE-BARRON (MISCELLANEOUS) ×2 IMPLANT
SUT BONE WAX W31G (SUTURE) ×2 IMPLANT
SUT ETHIBOND X763 2 0 SH 1 (SUTURE) ×4 IMPLANT
SUT MNCRL AB 3-0 PS2 18 (SUTURE) ×4 IMPLANT
SUT MNCRL AB 4-0 PS2 18 (SUTURE) IMPLANT
SUT PDS AB 1 CTX 36 (SUTURE) ×4 IMPLANT
SUT PROLENE 4 0 RB 1 (SUTURE) ×6
SUT PROLENE 4 0 SH DA (SUTURE) ×2 IMPLANT
SUT PROLENE 4-0 RB1 .5 CRCL 36 (SUTURE) IMPLANT
SUT PROLENE 5 0 C 1 36 (SUTURE) ×6 IMPLANT
SUT PROLENE 7 0 BV1 MDA (SUTURE) ×2 IMPLANT
SUT STEEL 6MS V (SUTURE) ×4 IMPLANT
SUT VIC AB 2-0 CT1 27 (SUTURE) ×2
SUT VIC AB 2-0 CT1 TAPERPNT 27 (SUTURE) IMPLANT
SUT VIC AB 2-0 CTX 36 (SUTURE) IMPLANT
SYSTEM SAHARA CHEST DRAIN ATS (WOUND CARE) ×2 IMPLANT
TAPE CLOTH SURG 4X10 WHT LF (GAUZE/BANDAGES/DRESSINGS) IMPLANT
TAPE PAPER 2X10 WHT MICROPORE (GAUZE/BANDAGES/DRESSINGS) IMPLANT
TOWEL GREEN STERILE (TOWEL DISPOSABLE) ×2 IMPLANT
TOWEL GREEN STERILE FF (TOWEL DISPOSABLE) ×2 IMPLANT
TRAY FOLEY SLVR 16FR TEMP STAT (SET/KITS/TRAYS/PACK) ×2 IMPLANT
TUBE SUCT INTRACARD DLP 20F (MISCELLANEOUS) IMPLANT
TUBE SUCTION CARDIAC 10FR (CANNULA) IMPLANT
TUBING ANTICOAG CELL SAVER (IV SETS) IMPLANT
TUBING LAP HI FLOW INSUFFLATIO (TUBING) ×2 IMPLANT
UNDERPAD 30X36 HEAVY ABSORB (UNDERPADS AND DIAPERS) ×2 IMPLANT
WATER STERILE IRR 1000ML POUR (IV SOLUTION) ×4 IMPLANT

## 2023-07-09 NOTE — Progress Notes (Signed)
     [address]       [name] [address]             [phone]       EVENING ROUNDS POD #0 CABG Doing well On no inotropes Weaning sedation Not bleeding

## 2023-07-09 NOTE — Transfer of Care (Cosign Needed)
Immediate Anesthesia Transfer of Care Note  Patient: Joanne Garner  Procedure(s) Performed: CORONARY ARTERY BYPASS GRAFTING (CABG) x 3 USING LEFT INTERNAL MAMMARY ARTERY AND ENDOSCOPICALY HARVESTED RIGHT GREATER SAPHENOUS VEIN (Chest) TRANSESOPHAGEAL ECHOCARDIOGRAM  Patient Location: ICU  Anesthesia Type:General  Level of Consciousness: Patient remains intubated per anesthesia plan  Airway & Oxygen Therapy: Patient remains intubated per anesthesia plan and Patient placed on Ventilator (see vital sign flow sheet for setting)  Post-op Assessment: Report given to RN and Post -op Vital signs reviewed and stable  Post vital signs: Reviewed and stable  Last Vitals:  Vitals /Value Taken Time  BP 108/63   Temp    Pulse 54 07/09/23 1203  Resp 16 07/09/23 1203  SpO2 100 % 07/09/23 1203  Vitals shown include unfiled device data.  Last Pain:  Vitals:   07/09/23 0517  TempSrc:   PainSc: 0-No pain      Patients Stated Pain Goal: 0 (07/09/23 0517)  Complications: No notable events documented.

## 2023-07-09 NOTE — Plan of Care (Signed)
  Problem: Education: Goal: Understanding of cardiac disease, CV risk reduction, and recovery process will improve Outcome: Progressing Goal: Individualized Educational Video(s) Outcome: Progressing   Problem: Cardiac: Goal: Ability to achieve and maintain adequate cardiovascular perfusion will improve Outcome: Progressing   Problem: Education: Goal: Knowledge of General Education information will improve Description: Including pain rating scale, medication(s)/side effects and non-pharmacologic comfort measures Outcome: Progressing   Problem: Clinical Measurements: Goal: Ability to maintain clinical measurements within normal limits will improve Outcome: Progressing Goal: Will remain free from infection Outcome: Progressing Goal: Diagnostic test results will improve Outcome: Progressing Goal: Respiratory complications will improve Outcome: Progressing Goal: Cardiovascular complication will be avoided Outcome: Progressing   Problem: Nutrition: Goal: Adequate nutrition will be maintained Outcome: Progressing   Problem: Coping: Goal: Level of anxiety will decrease Outcome: Progressing   Problem: Safety: Goal: Ability to remain free from injury will improve Outcome: Progressing

## 2023-07-09 NOTE — Consult Note (Signed)
NAME:  Joanne Garner, MRN:  [id number], DOB:  16-Jul-1966, LOS: 4 ADMISSION DATE:  07/05/2023, CONSULTATION DATE: 7/16 REFERRING MD: Dr. Cliffton Asters, CHIEF COMPLAINT: NSTEMI  History of Present Illness:  Patient is encephalopathic and/or intubated; therefore, history has been obtained from chart review.  57 year old female with past medical history as below, which is significant for hypertension, hyperlipidemia, and active pack per day smoker.  She presented to [facility] emergency department on 7/12 with complaints of chest heaviness and radiation to both arms.  While in the emergency department she was noted to be hypertensive with blood pressure 204/96.  Cardiac workup in the ED was significant for troponin 0.45 which increased to 1.2 and she was admitted for treatment of NSTEMI with heparin infusion.  Cardiology was consulted and the patient was ultimately transferred to [facility] for the purpose of undergoing left heart catheterization.  She was found to have complex distal left main stenosis involving the origin of the LAD, high diagonal, ramus intermedius, and left circumflex.  LV gram also concerning for Takotsubo cardiomyopathy but considering the significant coronary disease she is considered a candidate for thoracic surgery evaluation.  Ultimately underwent coronary artery bypass grafting x 3 using internal mammary and right greater saphenous vein on 7/16.  Postoperatively she was transferred to the ICU for recovery PCCM consulted for vent management.  Pertinent  Medical History   has no past medical history on file.   Significant Hospital Events: Including procedures, antibiotic start and stop dates in addition to other pertinent events   7/12 admitted to [facility] with NSTEMI and transferred to [facility] for left heart cath. 7/16 underwent CABG x 3  Interim History / Subjective:    Objective   Blood pressure 116/74, pulse 61, temperature 98.1 F (36.7 C), temperature  source Oral, resp. rate 18, height 5\' 4"  (1.626 m), weight 60.3 kg, SpO2 98%.        Intake/Output Summary (Last 24 hours) at 07/09/2023 1240 Last data filed at 07/09/2023 1144 Gross per 24 hour  Intake 2083.52 ml  Output 1305 ml  Net 778.52 ml   Filed Weights   07/05/23 1510 07/09/23 0709  Weight: 60.3 kg 60.3 kg    Examination: General: thin middle aged female in NAD on vent HENT: [place]/AT, pupils pinpoint Lungs: Clear bilateral breath sounds Cardiovascular: Borderline brady, regular Abdomen: Soft, ND, hypoactive Extremities: No acute deformity. Extremities warm Neuro: Sedated GU: Foley  Resolved Hospital Problem list     Assessment & Plan:   Acute coronary syndrome with significant left main disease Status post CABG x 3 on 716 Hypertension Hyperlipidemia -Management per CVTS and cardiology -Drains per CVTS, note zip tie on drain is very tight, may pose a drainage issue. Monitor closely.  -Multimodal pain control -Vent weaning per postop protocol -Currently on no pressors -Repeat chemistry, CBC now -Metoprolol twice daily -Statin -Precedex for RASS goal 0 to -2, wean per protocol  Best Practice (right click and "Reselect all SmartList Selections" daily)   Diet/type: NPO DVT prophylaxis: not indicated post op resume per primary GI prophylaxis: PPI Lines: Central line Foley:  Yes, and it is still needed Code Status:  full code Last date of multidisciplinary goals of care discussion [ ]   Labs   CBC: Recent Labs  Lab 07/06/23 0400 07/07/23 0148 07/08/23 0331 07/09/23 0005 07/09/23 0805 07/09/23 0952 07/09/23 1000 07/09/23 1032 07/09/23 1108 07/09/23 1119  WBC 3.8* 4.7 4.5 4.4  --   --   --   --   --   --  HGB 12.6 12.6 12.6 12.7   < > 9.2* 8.8* 8.7* 9.2* 9.5*  HCT 38.3 38.3 38.7 38.3   < > 27.0* 26.0* 26.9* 27.0* 28.0*  MCV 96.7 96.2 97.7 98.7  --   --   --   --   --   --   PLT 154 139* 138* 135*  --   --   --  100*  --   --    < > = values in this  interval not displayed.    Basic Metabolic Panel: Recent Labs  Lab 07/06/23 0400 07/09/23 0005 07/09/23 0805 07/09/23 0815 07/09/23 0924 07/09/23 0952 07/09/23 1000 07/09/23 1108 07/09/23 1119  NA 136 139   < > 139 139 138 138 137 136  K 4.2 3.9   < > 4.7 5.1 5.5* 5.5* 5.9* 5.9*  CL 103 104  --  106 105  --  107  --  108  CO2 26 25  --   --   --   --   --   --   --   GLUCOSE 120* 124*  --  110* 114*  --  97  --  106*  BUN 13 13  --  12 11  --  10  --  11  CREATININE 0.71 0.62  --  0.50 0.50  --  0.50  --  0.60  CALCIUM 9.1 9.5  --   --   --   --   --   --   --    < > = values in this interval not displayed.   GFR: Estimated Creatinine Clearance: 65.4 mL/min (by C-G formula based on SCr of 0.6 mg/dL). Recent Labs  Lab 07/06/23 0400 07/07/23 0148 07/08/23 0331 07/09/23 0005  WBC 3.8* 4.7 4.5 4.4    Liver Function Tests: No results for input(s): "AST", "ALT", "ALKPHOS", "BILITOT", "PROT", "ALBUMIN" in the last 168 hours. No results for input(s): "LIPASE", "AMYLASE" in the last 168 hours. No results for input(s): "AMMONIA" in the last 168 hours.  ABG    Component Value Date/Time   PHART 7.421 07/09/2023 1108   PCO2ART 38.4 07/09/2023 1108   PO2ART 185 (H) 07/09/2023 1108   HCO3 25.0 07/09/2023 1108   TCO2 25 07/09/2023 1119   ACIDBASEDEF 1.0 07/09/2023 0952   O2SAT 100 07/09/2023 1108     Coagulation Profile: Recent Labs  Lab 07/07/23 1642  INR 1.0    Cardiac Enzymes: No results for input(s): "CKTOTAL", "CKMB", "CKMBINDEX", "TROPONINI" in the last 168 hours.  HbA1C: Hgb A1c MFr Bld  Date/Time Value Ref Range Status  07/07/2023 04:42 PM 6.1 (H) 4.8 - 5.6 % Final    Comment:    (NOTE) Pre diabetes:          5.7%-6.4%  Diabetes:              >6.4%  Glycemic control for   <7.0% adults with diabetes     CBG: Recent Labs  Lab 07/09/23 1203  GLUCAP 102*    Review of Systems:   Patient is encephalopathic and/or intubated; therefore, history  has been obtained from chart review.    Past Medical History:  She,  has no past medical history on file.   Surgical History:   Past Surgical History:  Procedure Laterality Date   LEFT HEART CATH AND CORONARY ANGIOGRAPHY N/A 07/05/2023   Procedure: LEFT HEART CATH AND CORONARY ANGIOGRAPHY;  Surgeon: [place], Peter M, MD;  Location: [facility] INVASIVE CV LAB;  Service:  Cardiovascular;  Laterality: N/A;     Social History:   reports that she has been smoking cigarettes. She started smoking about 41 years ago. She has a 20.8 pack-year smoking history. She does not have any smokeless tobacco history on file.   Family History:  Her family history is not on file.   Allergies Allergies  Allergen Reactions   No Known Allergies     Per patient      Home Medications  Prior to Admission medications   Not on File     Critical care time: 32 min     Joneen Roach, AGACNP-BC [facility]  See Amion for personal pager PCCM on call pager [phone] until 7pm. Please call Elink 7p-7a. [phone]  07/09/2023 12:57 PM

## 2023-07-09 NOTE — Anesthesia Procedure Notes (Signed)
Procedure Name: Intubation Date/Time: 07/09/2023 7:48 AM  Performed by: Marena Chancy, CRNAPre-anesthesia Checklist: Patient identified, Emergency Drugs available, Suction available and Patient being monitored Patient Re-evaluated:Patient Re-evaluated prior to induction Oxygen Delivery Method: Circle system utilized Preoxygenation: Pre-oxygenation with 100% oxygen Induction Type: IV induction Ventilation: Mask ventilation without difficulty and Oral airway inserted - appropriate to patient size Laryngoscope Size: Mac and 3 Grade View: Grade I Tube type: Oral Tube size: 8.0 mm Number of attempts: 1 Airway Equipment and Method: Stylet and Oral airway Placement Confirmation: ETT inserted through vocal cords under direct vision, positive ETCO2 and breath sounds checked- equal and bilateral Secured at: 23 cm Tube secured with: Tape Dental Injury: Teeth and Oropharynx as per pre-operative assessment  Comments: Performed by Earleen Reaper, SRNA

## 2023-07-09 NOTE — Op Note (Signed)
[address]       [name] [address]             [phone]                                          07/09/2023 Patient:  Joanne Garner Pre-Op Dx: NSTEMI LM CAD HTN DM HLP    Post-op Dx:  same Procedure: CABG X 3.  LIMA LAD, RSVG RI, Diag   Endoscopic greater saphenous vein harvest on the right   Surgeon and Role:      * Cataldo Cosgriff, Eliezer Lofts, MD - Primary    * B. Stehler , PA-C - assisting An experienced assistant was required given the complexity of this surgery and the standard of surgical care. The assistant was needed for exposure, dissection, suctioning, retraction of delicate tissues and sutures, instrument exchange and for overall help during this procedure.    Anesthesia  general EBL:  Blood Administration: none Xclamp Time:  41 min Pump Time:   Drains: 19 F blake drain:  L, mediastinal  Wires: ventricular Counts: correct   Indications: 57yo female with LM disease, and reduced LV function. She has no significant valvular disease.   She was ruled in for a NSTEMI.  Findings: Improved LV function of pre-CABG TEE.  Small LIMA.  Good vein, good distal targets  Operative Technique: All invasive lines were placed in pre-op holding.  After the risks, benefits and alternatives were thoroughly discussed, the patient was brought to the operative theatre.  Anesthesia was induced, and the patient was prepped and draped in normal sterile fashion.  An appropriate surgical pause was performed, and pre-operative antibiotics were dosed accordingly.  We began with simultaneous incisions along the right leg for harvesting of the greater saphenous vein and the chest for the sternotomy.  In regards to the sternotomy, this was carried down with bovie cautery, and the sternum was divided with a reciprocating saw.  Meticulous hemostasis was obtained.  The left internal thoracic artery was exposed and harvested in in pedicled fashion.  The  patient was systemically heparinized, and the artery was divided distally, and placed in a papaverine sponge.    The sternal elevator was removed, and a retractor was placed.  The pericardium was divided in the midline and fashioned into a cradle with pericardial stitches.   After we confirmed an appropriate ACT, the ascending aorta was cannulated in standard fashion.  The right atrial appendage was used for venous cannulation site.  Cardiopulmonary bypass was initiated, and the heart retractor was placed. The cross clamp was applied, and a dose of anterograde cardioplegia was given with good arrest of the heart.   Next we exposed the lateral wall, and found a good target on the Ramus.  An end to side anastomosis with the vein graft was then created.  Next, we exposed the anterior wall of the heart and identified a good target on Diagonal.   An arteriotomy was created.  The vein was anastomosed in an end to side fashion.  Finally, we exposed a good target on the LAD, and fashioned an end to side anastomosis between it and the LITA.  We began to re-warm, and a re-animation dose of cardioplegia was given.  The heart was de-aired, and the cross clamp was removed.  Meticulous hemostasis was obtained.  A partial occludding clamp was then placed on the ascending aorta, and we created an end to side anastomosis between it and the proximal vein grafts.  Rings were placed on the proximal anastomosis.  Hemostasis was obtained, and we separated from cardiopulmonary bypass without event.  The heparin was reversed with protamine.  Chest tubes and wires were placed, and the sternum was re-approximated with sternal wires.  The soft tissue and skin were re-approximated wth absorbable suture.    The patient tolerated the procedure without any immediate complications, and was transferred to the ICU in guarded condition.  Jhamal Plucinski Keane Scrape

## 2023-07-09 NOTE — Anesthesia Preprocedure Evaluation (Addendum)
Anesthesia Evaluation  Patient identified by MRN, date of birth, ID band Patient awake    Reviewed: Allergy & Precautions, NPO status , Patient's Chart, lab work & pertinent test results  Airway Mallampati: I  TM Distance: >3 FB Neck ROM: Full    Dental  (+) Edentulous Upper, Edentulous Lower   Pulmonary Current Smoker and Patient abstained from smoking.   breath sounds clear to auscultation       Cardiovascular + Past MI   Rhythm:Regular Rate:Normal  Echo: 1. Sluggish apical flow with Definity contrast, but no thrombus. Left  ventricular ejection fraction, by estimation, is 35 to 40%. The left  ventricle has moderately decreased function. The left ventricle  demonstrates regional wall motion abnormalities  (see scoring diagram/findings for description). There is mild left  ventricular hypertrophy. Left ventricular diastolic parameters are  consistent with Grade I diastolic dysfunction (impaired relaxation). There  is severe akinesis of the left ventricular,  mid-apical anteroseptal wall, anterior wall, apical segment and inferior  wall.   2. Right ventricular systolic function is normal. The right ventricular  size is normal. Tricuspid regurgitation signal is inadequate for assessing  PA pressure.   3. The mitral valve is grossly normal. Trivial mitral valve  regurgitation.   4. The aortic valve is tricuspid. Aortic valve regurgitation is not  visualized.   5. The inferior vena cava is normal in size with <50% respiratory  variability, suggesting right atrial pressure of 8 mmHg.     Neuro/Psych negative neurological ROS  negative psych ROS   GI/Hepatic negative GI ROS, Neg liver ROS,,,  Endo/Other  negative endocrine ROS    Renal/GU negative Renal ROS     Musculoskeletal negative musculoskeletal ROS (+)    Abdominal   Peds  Hematology negative hematology ROS (+)   Anesthesia Other Findings    Reproductive/Obstetrics                             Anesthesia Physical Anesthesia Plan  ASA: 4  Anesthesia Plan: General   Post-op Pain Management:    Induction: Intravenous  PONV Risk Score and Plan: 2 and Ondansetron, Midazolam and Dexamethasone  Airway Management Planned: Oral ETT  Additional Equipment: Arterial line, CVP, TEE and Ultrasound Guidance Line Placement  Intra-op Plan:   Post-operative Plan: Post-operative intubation/ventilation  Informed Consent: I have reviewed the patients History and Physical, chart, labs and discussed the procedure including the risks, benefits and alternatives for the proposed anesthesia with the patient or authorized representative who has indicated his/her understanding and acceptance.       Plan Discussed with: CRNA  Anesthesia Plan Comments:        Anesthesia Quick Evaluation

## 2023-07-09 NOTE — Procedures (Signed)
Extubation Procedure Note  Patient Details:   Name: Joanne Garner DOB: 08/01/1966 MRN: [id number]   Airway Documentation:    Vent end date: 07/09/23 Vent end time: 1602   Evaluation  O2 sats: stable throughout Complications: No apparent complications Patient did tolerate procedure well. Bilateral Breath Sounds: Clear, Diminished   Yes  Pt extubated to 4L [place], pt tolerating well at this time. Cuff leak present, No stridor noted, RN at bedside, RT will monitor as needed.   Thornell Mule 07/09/2023, 4:24 PM

## 2023-07-09 NOTE — TOC Benefit Eligibility Note (Signed)
Pharmacy Patient Advocate Encounter  Insurance verification completed.    The patient is insured through [employer] test claim for [employer] 24-26 mg and the current 30 day co-pay is $682.43 due to a $6000 deductible.  Ran test claim for Farxiga 10 mg and Requires Prior Authorizaton  Ran test claim for Jardiance 10 mg and Requires Prior Authorization  This test claim was processed through [employer]- copay amounts may vary at other pharmacies due to [employer], or as the patient moves through the different stages of their insurance plan.    Roland Earl, CPHT Pharmacy Patient Advocate Specialist [facility] Patient Advocate Team Direct Number: [phone]  Fax: [phone]

## 2023-07-09 NOTE — Progress Notes (Signed)
Patient screamed repeatedly at RN to remove BiPAP mask. Mask removed, patient stated that it made her very claustrophobic and is unable to wear it at this time. Placed back on nasal cannula. Respiratory therapist made aware. Patient has no other complaints or concerns at this time, resting comfortably.

## 2023-07-09 NOTE — Brief Op Note (Signed)
07/05/2023 - 07/09/2023  1:05 PM  PATIENT:  Joanne Garner  57 y.o. female  PRE-OPERATIVE DIAGNOSIS:  coronary artery disease  POST-OPERATIVE DIAGNOSIS:  coronary artery disease  PROCEDURE:  Procedure(s): CORONARY ARTERY BYPASS GRAFTING (CABG) x 3 USING LEFT INTERNAL MAMMARY ARTERY AND ENDOSCOPICALY HARVESTED RIGHT GREATER SAPHENOUS VEIN (N/A) TRANSESOPHAGEAL ECHOCARDIOGRAM (N/A) Vein harvest time: Vein prep time: -LIMA to LAD -SVG to Diagonal -SVG to Ramus  SURGEON:  Surgeons and Role:    * [name], MD - Primary  PHYSICIAN ASSISTANT: Aloha Gell PA-C  ASSISTANTS: Tanda Rockers RNFA   ANESTHESIA:   general  EBL:  100 mL   BLOOD ADMINISTERED:none  DRAINS:  Mediastinal and pleural drains    LOCAL MEDICATIONS USED:  NONE  SPECIMEN:  No Specimen  DISPOSITION OF SPECIMEN:  N/A  COUNTS CORRECT:  YES  DICTATION: .Dragon Dictation  PLAN OF CARE: Admit to inpatient   PATIENT DISPOSITION:  ICU - intubated and hemodynamically stable.   Delay start of Pharmacological VTE agent (>24hrs) due to surgical blood loss or risk of bleeding: yes

## 2023-07-09 NOTE — Progress Notes (Signed)
Patients arterial blood gas still shows respiratory acidosis. Patient placed on bipap.

## 2023-07-09 NOTE — Progress Notes (Signed)
Pt achieved a NIF of -25 and VC of 1.04 with great pt effort on all efforts. RN at bedside.

## 2023-07-09 NOTE — Anesthesia Postprocedure Evaluation (Signed)
Anesthesia Post Note  Patient: Verna Desrocher  Procedure(s) Performed: CORONARY ARTERY BYPASS GRAFTING (CABG) x 3 USING LEFT INTERNAL MAMMARY ARTERY AND ENDOSCOPICALY HARVESTED RIGHT GREATER SAPHENOUS VEIN (Chest) TRANSESOPHAGEAL ECHOCARDIOGRAM     Patient location during evaluation: SICU Anesthesia Type: General Level of consciousness: sedated Pain management: pain level controlled Vital Signs Assessment: post-procedure vital signs reviewed and stable Respiratory status: patient remains intubated per anesthesia plan Cardiovascular status: stable Postop Assessment: no apparent nausea or vomiting Anesthetic complications: no  No notable events documented.  Last Vitals:  Vitals:   07/09/23 1600 07/09/23 1615  BP: 97/66   Pulse: 71 69  Resp: 20 (!) 23  Temp: 37.1 C 37.3 C  SpO2: 100% 100%    Last Pain:  Vitals:   07/09/23 1600  TempSrc:   PainSc: 10-Worst pain ever                 Shelton Silvas

## 2023-07-09 NOTE — Hospital Course (Addendum)
History of Present Illness:     Joanne Garner is a 57 year old female who presented to [facility] ED on 07/12 reporting symptoms of chest heaviness with pain in bilateral arms and shoulders.  She states she did not go to the ED due to these symptoms but instead because she had a bump on her face that she was concerned about and needed blood work for. While in the ED she was noted to be hypertensive with systolic pressures up to the 200s. She does report increased fatigue recently, her symptoms improve with rest. Her troponin peaked at 1.2 and she was ruled in for NSTEMI. She was transferred to [facility] and underwent a cardiac catheterization on 07/12 which showed complex distal left main stenosis involving the origin of LAD, high diagonal, ramus intermediate and left circumflex as well as moderate LV dysfunction with anterior and apical wall motion abnormality and a mildly elevated LVEDP. Her echocardiogram on 07/13 showed LVEF 35-40% with grade I diastolic dysfunction and severe akinesis of the left ventricular, mid-apical anteroseptal wall, anterior wall, apical segment and inferior wall.   Dr. Cliffton Asters reviewed the patient's diagnostic studies and determined she would benefit from surgical intervention. He reviewed the treatment options as well as the risks and benefits. Ms. Age was agreeable to proceed with surgery.  Hospital Course: [name] was admitted to [facility] on 07/12 and remained in stable condition until she was brought to the operating room on 07/16. She underwent CABG x 3 utilizing LIMA to LAD, SVG to Diagonal and SVG to Ramus as well as endoscopic harvest of the right greater saphenous vein. She tolerated the procedure well and was transferred to the SICU in stable condition. She remained hemodynamically stable and required no inotropic support. She was weaned from the mechanical ventilator and extubated by 4pm on the day of surgery. BiPAP was used for a few hours for  respiratory acidosis but she did not tolerate this well due to claustrophobia. She was transitioned to nasal cannula O2 and the acidosis gradually corrected. Monitoring line and chest tubes were removed routinely and she was mobilized. She was ready for transfer to the Progressive Care unit on post op day 2. Mobility and pulmonary hygiene were encouraged.  Diet was advanced and well tolerated. She had expected acute blood loss anemia and thrombocytopenia that required no transfusion Blood counts were monitored.

## 2023-07-09 NOTE — Progress Notes (Signed)
Pre-extubation ABG marginal with pH 7.288, pCO2 54, pO2 114, HCO3 26. Pt wide awake and passed respiratory parameters. Dr Myrla Halsted notified of ABG results. Per Dr Katrinka Blazing, OK to proceed with extubation.

## 2023-07-09 NOTE — Anesthesia Procedure Notes (Signed)
Central Venous Catheter Insertion Performed by: Shelton Silvas, MD, anesthesiologist Start/End7/16/2024 6:45 AM, 07/09/2023 6:55 AM Patient location: Pre-op. Preanesthetic checklist: patient identified, IV checked, site marked, risks and benefits discussed, surgical consent, monitors and equipment checked, pre-op evaluation, timeout performed and anesthesia consent Position: Trendelenburg Lidocaine 1% used for infiltration and patient sedated Hand hygiene performed , maximum sterile barriers used  and Seldinger technique used Catheter size: 8.5 Fr Total catheter length 10. Central line was placed.Sheath introducer Swan type:thermodilution PA Cath depth:50 Procedure performed using ultrasound guided technique. Ultrasound Notes:anatomy identified, needle tip was noted to be adjacent to the nerve/plexus identified, no ultrasound evidence of intravascular and/or intraneural injection and image(s) printed for medical record Attempts: 1 Following insertion, line sutured and dressing applied. Post procedure assessment: blood return through all ports, free fluid flow and no air  Patient tolerated the procedure well with no immediate complications.

## 2023-07-09 NOTE — Progress Notes (Signed)
     [address]       [place] [id number]             [phone]       No events Vitals:   07/08/23 2100 07/09/23 0517  BP: 115/69 116/74  Pulse: (!) 53 61  Resp: 17 18  Temp: 98.1 F (36.7 C)   SpO2: 96% 98%   Alert NAD Sinus brady EWOB  OR for CABG

## 2023-07-09 NOTE — Anesthesia Procedure Notes (Signed)
Arterial Line Insertion Start/End7/16/2024 6:45 AM, 07/09/2023 7:00 AM Performed by: Shelton Silvas, MD, Marena Chancy, CRNA, CRNA  Patient location: Pre-op. Preanesthetic checklist: patient identified, IV checked, site marked, risks and benefits discussed, surgical consent, monitors and equipment checked, pre-op evaluation, timeout performed and anesthesia consent Lidocaine 1% used for infiltration Left, radial was placed Catheter size: 20 G Hand hygiene performed  and maximum sterile barriers used  Allen's test indicative of satisfactory collateral circulation Attempts: 1 Procedure performed without using ultrasound guided technique. Following insertion, dressing applied and Biopatch. Post procedure assessment: normal and unchanged  Patient tolerated the procedure well with no immediate complications. Additional procedure comments: Performed by Earleen Reaper, SRNA with supervision from CRNA.

## 2023-07-10 ENCOUNTER — Inpatient Hospital Stay (HOSPITAL_COMMUNITY): Payer: Managed Care, Other (non HMO)

## 2023-07-10 ENCOUNTER — Other Ambulatory Visit (HOSPITAL_COMMUNITY): Payer: Self-pay

## 2023-07-10 ENCOUNTER — Encounter (HOSPITAL_COMMUNITY): Payer: Self-pay | Admitting: Thoracic Surgery (Cardiothoracic Vascular Surgery)

## 2023-07-10 DIAGNOSIS — I214 Non-ST elevation (NSTEMI) myocardial infarction: Secondary | ICD-10-CM | POA: Diagnosis not present

## 2023-07-10 LAB — GLUCOSE, CAPILLARY
Glucose-Capillary: 101 mg/dL — ABNORMAL HIGH (ref 70–99)
Glucose-Capillary: 107 mg/dL — ABNORMAL HIGH (ref 70–99)
Glucose-Capillary: 115 mg/dL — ABNORMAL HIGH (ref 70–99)
Glucose-Capillary: 125 mg/dL — ABNORMAL HIGH (ref 70–99)
Glucose-Capillary: 127 mg/dL — ABNORMAL HIGH (ref 70–99)
Glucose-Capillary: 141 mg/dL — ABNORMAL HIGH (ref 70–99)
Glucose-Capillary: 98 mg/dL (ref 70–99)

## 2023-07-10 LAB — CBC
HCT: 31.9 % — ABNORMAL LOW (ref 36.0–46.0)
HCT: 30.8 % — ABNORMAL LOW (ref 36.0–46.0)
Hemoglobin: 10.3 g/dL — ABNORMAL LOW (ref 12.0–15.0)
Hemoglobin: 9.9 g/dL — ABNORMAL LOW (ref 12.0–15.0)
MCH: 33 pg (ref 26.0–34.0)
MCH: 32.2 pg (ref 26.0–34.0)
MCHC: 32.3 g/dL (ref 30.0–36.0)
MCHC: 32.1 g/dL (ref 30.0–36.0)
MCV: 102.2 fL — ABNORMAL HIGH (ref 80.0–100.0)
MCV: 100.3 fL — ABNORMAL HIGH (ref 80.0–100.0)
Platelets: 107 10*3/uL — ABNORMAL LOW (ref 150–400)
Platelets: 104 10*3/uL — ABNORMAL LOW (ref 150–400)
RBC: 3.12 MIL/uL — ABNORMAL LOW (ref 3.87–5.11)
RBC: 3.07 MIL/uL — ABNORMAL LOW (ref 3.87–5.11)
RDW: 12.8 % (ref 11.5–15.5)
RDW: 12.8 % (ref 11.5–15.5)
WBC: 8 10*3/uL (ref 4.0–10.5)
WBC: 7.6 10*3/uL (ref 4.0–10.5)
nRBC: 0 % (ref 0.0–0.2)
nRBC: 0 % (ref 0.0–0.2)

## 2023-07-10 LAB — BASIC METABOLIC PANEL
Anion gap: 9 (ref 5–15)
Anion gap: 7 (ref 5–15)
BUN: 10 mg/dL (ref 6–20)
BUN: 7 mg/dL (ref 6–20)
CO2: 24 mmol/L (ref 22–32)
CO2: 27 mmol/L (ref 22–32)
Calcium: 8.8 mg/dL — ABNORMAL LOW (ref 8.9–10.3)
Calcium: 9 mg/dL (ref 8.9–10.3)
Chloride: 101 mmol/L (ref 98–111)
Chloride: 98 mmol/L (ref 98–111)
Creatinine, Ser: 0.62 mg/dL (ref 0.44–1.00)
Creatinine, Ser: 0.54 mg/dL (ref 0.44–1.00)
GFR, Estimated: >60 mL/min (ref >60)
GFR, Estimated: >60 mL/min (ref >60)
Glucose, Bld: 121 mg/dL — ABNORMAL HIGH (ref 70–99)
Glucose, Bld: 126 mg/dL — ABNORMAL HIGH (ref 70–99)
Potassium: 4.5 mmol/L (ref 3.5–5.1)
Potassium: 4.2 mmol/L (ref 3.5–5.1)
Sodium: 134 mmol/L — ABNORMAL LOW (ref 135–145)
Sodium: 132 mmol/L — ABNORMAL LOW (ref 135–145)

## 2023-07-10 LAB — MAGNESIUM
Magnesium: 2.3 mg/dL (ref 1.7–2.4)
Magnesium: 2 mg/dL (ref 1.7–2.4)

## 2023-07-10 LAB — LIPID PANEL
Cholesterol: 121 mg/dL (ref 0–200)
HDL: 49 mg/dL (ref >40)
LDL Cholesterol: 60 mg/dL (ref 0–99)
Total CHOL/HDL Ratio: 2.5 RATIO
Triglycerides: 58 mg/dL (ref <150)
VLDL: 12 mg/dL (ref 0–40)

## 2023-07-10 MED ORDER — ENOXAPARIN SODIUM 30 MG/0.3ML IJ SOSY
30.0000 mg | PREFILLED_SYRINGE | Freq: Every day | INTRAMUSCULAR | Status: DC
  Administered 2023-07-10: 30 mg via SUBCUTANEOUS
  Filled 2023-07-10: qty 0.3

## 2023-07-10 NOTE — Progress Notes (Signed)
   Patient Name: Joanne Garner Date of Encounter: 07/10/2023 [facility] Cardiologist: None   Interval Summary  .    Patient seen post op day 1. Noted anxiety last night. Breathing is OK. Chest sore  Vital Signs .    Vitals:   07/10/23 0630 07/10/23 0645 07/10/23 0700 07/10/23 0800  BP:   120/73 115/69  Pulse: 74 80 76 72  Resp: 12 (!) 22 15 17   Temp: 99.1 F (37.3 C) 99 F (37.2 C) 98.8 F (37.1 C) 98.4 F (36.9 C)  TempSrc:      SpO2: 100% 91% 100% 100%  Weight:      Height:        Intake/Output Summary (Last 24 hours) at 07/10/2023 0831 Last data filed at 07/10/2023 0800 Gross per 24 hour  Intake 3258.11 ml  Output 3682 ml  Net -423.89 ml      07/10/2023    5:00 AM 07/09/2023    7:09 AM 07/05/2023    3:10 PM  Last 3 Weights  Weight (lbs) 136 lb 11 oz 133 lb 133 lb  Weight (kg) 62 kg 60.328 kg 60.328 kg      Telemetry/ECG    Sinus rhythm - Personally Reviewed  Physical Exam .   GEN: No acute distress.   Neck: No JVD Cardiac: RRR, no murmurs, rubs, or gallops. Chest tube in place. Surgical dressing clean Respiratory: Clear to auscultation bilaterally. GI: Soft, nontender, non-distended  MS: No edema  Assessment & Plan .     NSTEMI/CAD  Joanne Garner presented to [facility] ED on 07/05/23 reporting symptoms of nonspecific chest heaviness with pain in bilateral arms/shoulders, was found with mildly elevated troponin. She was transferred to [facility] for [facility] and found with complex distal left main stenosis, involving origin of LAD, high diagonal, ramus intermediate and Lcx. CTS was consulted and she is s/p CABG yesterday  Continue ASA 81mg  Continue rosuvastatin 20mg   Continue metoprolol  2. Acute systolic CHF  TTE this admission with LVEF 35-40%, severe akinesis of LV mid-apical anteroseptal wall, anterior wall, apical segment, and inferior wall.  Patient euvolemic on exam. Right heart pressures good. On no pressors Continue metoprolol for now.  As BP  tolerates would add low dose ARB and/or aldactone with thoughts of switching to [facility] prior to DC. Will check cost Also consider SGLT2 - will check cost.   3. Hypertension  BP  well controlled this admission.   4. Hyperlipidemia  Lipid panel at [facility] found total cholesterol 212, LDL 118, HDL 73. Continue Crestor 20mg .   For questions or updates, please contact [facility] Please consult [email or web address] for contact info under        Signed, Craven Crean [place], MD

## 2023-07-10 NOTE — Progress Notes (Addendum)
TCTS DAILY ICU PROGRESS NOTE                   [address]            [place] [id number]          [phone]   1 Day Post-Op Procedure(s) (LRB): CORONARY ARTERY BYPASS GRAFTING (CABG) x 3 USING LEFT INTERNAL MAMMARY ARTERY AND ENDOSCOPICALY HARVESTED RIGHT GREATER SAPHENOUS VEIN (N/A) TRANSESOPHAGEAL ECHOCARDIOGRAM (N/A)  Total Length of Stay:  LOS: 5 days   Subjective: Extubated around 4pm yesterday.  Awake and alert, having some pain at the MT exit site.   Objective: Vital signs in last 24 hours: Temp:  [95.4 F (35.2 C)-99.7 F (37.6 C)] 98.4 F (36.9 C) (07/17 0800) Pulse Rate:  [53-93] 72 (07/17 0800) Cardiac Rhythm: Normal sinus rhythm;Ventricular paced (07/17 0400) Resp:  [9-28] 17 (07/17 0800) BP: (97-134)/(58-104) 115/69 (07/17 0800) SpO2:  [91 %-100 %] 100 % (07/17 0800) Arterial Line BP: (91-155)/(11-81) 121/54 (07/17 0800) FiO2 (%):  [40 %-50 %] 40 % (07/16 2131) Weight:  [62 kg] 62 kg (07/17 0500)  Filed Weights   07/05/23 1510 07/09/23 0709 07/10/23 0500  Weight: 60.3 kg 60.3 kg 62 kg    Weight change:    Hemodynamic parameters for last 24 hours: CVP:  [4 mmHg-16 mmHg] 5 mmHg  Intake/Output from previous day: 07/16 0701 - 07/17 0700 In: 3258.1 [I.V.:1819.4; IV Piggyback:1438.8] Out: 3627 [Urine:3117; Blood:100; Chest Tube:410]  Intake/Output this shift: Total I/O In: 0  Out: 55 [Urine:45; Chest Tube:10]  Current Meds: Scheduled Meds:  acetaminophen  1,000 mg Oral Q6H   Or   acetaminophen (TYLENOL) oral liquid 160 mg/5 mL  1,000 mg Per Tube Q6H   aspirin EC  325 mg Oral Daily   Or   aspirin  324 mg Per Tube Daily   bisacodyl  10 mg Oral Daily   Or   bisacodyl  10 mg Rectal Daily   Chlorhexidine Gluconate Cloth  6 each Topical Daily   docusate sodium  200 mg Oral Daily   insulin aspart  0-9 Units Subcutaneous Q4H   metoCLOPramide (REGLAN) injection  10 mg Intravenous Q6H   metoprolol tartrate  12.5 mg Oral BID   Or    metoprolol tartrate  12.5 mg Per Tube BID   [START ON 07/11/2023] pantoprazole  40 mg Oral Daily   pantoprazole (PROTONIX) IV  40 mg Intravenous QHS   rosuvastatin  20 mg Oral Daily   sodium chloride flush  10-40 mL Intracatheter Q12H   sodium chloride flush  3 mL Intravenous Q12H   Continuous Infusions:  sodium chloride Stopped (07/09/23 1743)   sodium chloride     sodium chloride     albumin human 999 mL/hr at 07/10/23 0800    ceFAZolin (ANCEF) IV Stopped (07/10/23 2355)   dexmedetomidine (PRECEDEX) IV infusion Stopped (07/09/23 1815)   lactated ringers     lactated ringers Stopped (07/10/23 0500)   nitroGLYCERIN Stopped (07/09/23 1231)   phenylephrine (NEO-SYNEPHRINE) Adult infusion Stopped (07/09/23 1155)   PRN Meds:.sodium chloride, albumin human, dextrose, metoprolol tartrate, midazolam, morphine injection, ondansetron (ZOFRAN) IV, mouth rinse, oxyCODONE, sodium chloride flush, sodium chloride flush, traMADol  General appearance: alert, cooperative, mild distress, and mild anxiety Neurologic: intact Heart: RRR, no significant arryhthmias, CI>3, on no inotropes or pressors. Lungs: clear breath sounds with good resp effort. Stable sats. CXR with no unexpected changes. CT drainage past 12 hours.  Abdomen: soft, no tenderness,  few bowel sounds Extremities: no peripheral edema, all well perfused.  Wound: The sternotomy incision is covered with a dry Aquacel dressing.   Lab Results: CBC: Recent Labs    07/09/23 1753 07/09/23 2116 07/10/23 0421  WBC 6.7  --  8.0  HGB 10.5* 11.2* 10.3*  HCT 31.7* 33.0* 31.9*  PLT 103*  --  107*   BMET:  Recent Labs    07/09/23 1753 07/09/23 2116 07/10/23 0421  NA 140 139 134*  K 4.8 5.2* 4.5  CL 106  --  101  CO2 26  --  24  GLUCOSE 116*  --  121*  BUN 10  --  10  CREATININE 0.76  --  0.62  CALCIUM 9.0  --  8.8*    CMET: Lab Results  Component Value Date   WBC 8.0 07/10/2023   HGB 10.3 (L) 07/10/2023   HCT 31.9 (L)  07/10/2023   PLT 107 (L) 07/10/2023   GLUCOSE 121 (H) 07/10/2023   CHOL 121 07/10/2023   TRIG 58 07/10/2023   HDL 49 07/10/2023   LDLCALC 60 07/10/2023   NA 134 (L) 07/10/2023   K 4.5 07/10/2023   CL 101 07/10/2023   CREATININE 0.62 07/10/2023   BUN 10 07/10/2023   CO2 24 07/10/2023   INR 1.3 (H) 07/09/2023   HGBA1C 6.1 (H) 07/07/2023      PT/INR:  Recent Labs    07/09/23 1219  LABPROT 16.5*  INR 1.3*   Radiology: [facility] Chest Port 1 View  Result Date: 07/09/2023 CLINICAL DATA:  CABG. EXAM: PORTABLE CHEST 1 VIEW COMPARISON:  Chest x-ray 07/07/2023. FINDINGS: Right IJ approach central venous catheter with the tip at the lower SVC. Gastric tube courses below the diaphragm with the tip likely in the stomach. Endotracheal tube tip is at the lower level of the clavicular heads. Mediastinal drain in place. Mild interstitial prominence. No consolidation. No visible pleural effusions or pneumothorax. Streaky left basilar opacities, likely atelectasis. IMPRESSION: 1. Mild interstitial edema and probable left basilar atelectasis. 2. Lines/tubes detailed above. Electronically Signed   By: Feliberto Harts M.D.   On: 07/09/2023 13:43   EP STUDY  Result Date: 07/09/2023 See surgical note for result.    Assessment/Plan: S/P Procedure(s) (LRB): CORONARY ARTERY BYPASS GRAFTING (CABG) x 3 USING LEFT INTERNAL MAMMARY ARTERY AND ENDOSCOPICALY HARVESTED RIGHT GREATER SAPHENOUS VEIN (N/A) TRANSESOPHAGEAL ECHOCARDIOGRAM (N/A)  -POD1 CABG x 3 for MVCAD after presenting with NSTEMI, EF 35-40%.  Stable hemodynamics and cardiac rhythm with no inotropic or vasopressor support. On ASA, metoprolol, and Crestor.  Will remove monitoring lines and pleural tube. Mobilize.  Add ARB, Entresto, SGLT2 later per cardiology recs.   Neuro- intact. Pain control adequate.  -PULM- respiratory acidosis early post extubation, corrected. Appears comfortable on 4L [place] O2.  Encourage pulm hygiene and wean O2 as tolerated.    -HEME- mild expected acute blood loss anemia. Monitor.   -RENAL-normal function at baseline. Wt less than 2kg positive since pre-op.   -ENDO- Pre-op A1C 6.1 pre-diabetes on no medications prior to admission.  CBG's 120's. Continue to monitor.   -DVT PPX- mobilize and add daily [place] enoxaparin.    Leary Roca, PA-C 07/10/2023 8:39 AM   Agree with above POD1 progression  [name]

## 2023-07-10 NOTE — TOC Benefit Eligibility Note (Signed)
Pharmacy Patient Advocate Encounter  Received notification from [employer] that Prior Authorization for Jardiance 10mg  has been APPROVED from 07/10/23 to 12/23/2098.Marland Kitchen  PA #/Case ID/Reference #:  [id number]  Per test claim, copay for Jardiance 10mg  is $0 for 30 day supply

## 2023-07-10 NOTE — TOC Initial Note (Signed)
Transition of Care [facility]) - Initial/Assessment Note    Patient Details  Name: Joanne Garner MRN: [id number] Date of Birth: [birth year]  [facility] CM/SW Contact:    Elliot Cousin, RN Phone Number: [phone] 07/10/2023, 12:59 PM  Clinical Narrative:    TOC CM spoke to pt and husband at bedside. Pt will follow up with her employer on FMLA/disability paperwork. Provided CM contact information. Pt was independent PTA.                Expected Discharge Plan: Home/Self Care Barriers to Discharge: Continued Medical Work up   Patient Goals and CMS Choice Patient states their goals for this hospitalization and ongoing recovery are:: wants to get better          Expected Discharge Plan and Services   Discharge Planning Services: CM Consult   Living arrangements for the past 2 months: Single Family Home                                      Prior Living Arrangements/Services Living arrangements for the past 2 months: Single Family Home Lives with:: Spouse, Adult Children Patient language and need for interpreter reviewed:: Yes Do you feel safe going back to the place where you live?: Yes      Need for Family Participation in Patient Care: Yes (Comment) Care giver support system in place?: Yes (comment)   Criminal Activity/Legal Involvement Pertinent to Current Situation/Hospitalization: No - Comment as needed  Activities of Daily Living Home Assistive Devices/Equipment: None ADL Screening (condition at time of admission) Patient's cognitive ability adequate to safely complete daily activities?: Yes Is the patient deaf or have difficulty hearing?: No Does the patient have difficulty seeing, even when wearing glasses/contacts?: No Does the patient have difficulty concentrating, remembering, or making decisions?: No Patient able to express need for assistance with ADLs?: Yes Does the patient have difficulty dressing or bathing?: No Independently  performs ADLs?: Yes (appropriate for developmental age) Does the patient have difficulty walking or climbing stairs?: No Weakness of Legs: None Weakness of Arms/Hands: None  Permission Sought/Granted Permission sought to share information with : Case Manager, Family Supports Permission granted to share information with : Yes, Verbal Permission Granted  Share Information with NAME: Katrinna Travieso  Permission granted to share info w AGENCY: PCP  Permission granted to share info w Relationship: husband  Permission granted to share info w Contact Information: [phone]  Emotional Assessment Appearance:: Appears stated age Attitude/Demeanor/Rapport: Engaged Affect (typically observed): Accepting Orientation: : Oriented to Self, Oriented to Place, Oriented to  Time, Oriented to Situation   Psych Involvement: No (comment)  Admission diagnosis:  NSTEMI (non-ST elevated myocardial infarction) (HCC) [I21.4] S/P CABG x 3 [Z95.1] Patient Active Problem List   Diagnosis Date Noted   S/P CABG x 3 07/09/2023   NSTEMI (non-ST elevated myocardial infarction) (HCC) 07/05/2023   PCP:  Patient, No Pcp Per Pharmacy:  No Pharmacies Listed    Social Determinants of Health (SDOH) Social History: SDOH Screenings   Food Insecurity: Food Insecurity Present (07/08/2023)  Housing: Medium Risk (07/08/2023)  Transportation Needs: Unmet Transportation Needs (07/08/2023)  Utilities: At Risk (07/08/2023)  Tobacco Use: High Risk (07/09/2023)   SDOH Interventions:     Readmission Risk Interventions     No data to display

## 2023-07-10 NOTE — Progress Notes (Signed)
NAME:  Joanne Garner, MRN:  [id number], DOB:  06-Feb-1966, LOS: 5 ADMISSION DATE:  07/05/2023, CONSULTATION DATE: 7/16 REFERRING MD: Dr. Cliffton Asters, CHIEF COMPLAINT: NSTEMI  History of Present Illness:  Patient is encephalopathic and/or intubated; therefore, history has been obtained from chart review.  57 year old female with past medical history as below, which is significant for hypertension, hyperlipidemia, and active pack per day smoker.  She presented to [facility] emergency department on 7/12 with complaints of chest heaviness and radiation to both arms.  While in the emergency department she was noted to be hypertensive with blood pressure 204/96.  Cardiac workup in the ED was significant for troponin 0.45 which increased to 1.2 and she was admitted for treatment of NSTEMI with heparin infusion.  Cardiology was consulted and the patient was ultimately transferred to [facility] for the purpose of undergoing left heart catheterization.  She was found to have complex distal left main stenosis involving the origin of the LAD, high diagonal, ramus intermedius, and left circumflex.  LV gram also concerning for Takotsubo cardiomyopathy but considering the significant coronary disease she is considered a candidate for thoracic surgery evaluation.  Ultimately underwent coronary artery bypass grafting x 3 using internal mammary and right greater saphenous vein on 7/16.  Postoperatively she was transferred to the ICU for recovery PCCM consulted for vent management.  Pertinent  Medical History   has no past medical history on file.   Significant Hospital Events: Including procedures, antibiotic start and stop dates in addition to other pertinent events   7/12 admitted to [facility] with NSTEMI and transferred to [facility] for left heart cath. 7/16 underwent CABG x 3 7/17 extubated, not requiring pressors or inotropes  Interim History / Subjective:   Stable, overall improving   Objective    Blood pressure 115/69, pulse 72, temperature 98.4 F (36.9 C), resp. rate 17, height 5\' 4"  (1.626 m), weight 62 kg, SpO2 100%. CVP:  [4 mmHg-16 mmHg] 5 mmHg  Vent Mode: BIPAP FiO2 (%):  [40 %-50 %] 40 % Set Rate:  [4 bmp-16 bmp] 15 bmp Vt Set:  [470 mL] 470 mL PEEP:  [5 cmH20] 5 cmH20 Pressure Support:  [10 cmH20] 10 cmH20   Intake/Output Summary (Last 24 hours) at 07/10/2023 4098 Last data filed at 07/10/2023 0800 Gross per 24 hour  Intake 3258.11 ml  Output 3682 ml  Net -423.89 ml   Filed Weights   07/05/23 1510 07/09/23 0709 07/10/23 0500  Weight: 60.3 kg 60.3 kg 62 kg   General:  thin F, resting in bed in no acute distress HEENT: MM pink/moist Neuro: alert and oriented  CV: s1s2 rrr, no m/r/g PULM:  clear bilaterally on [place], mediastinal tube in place with blood tinged serosanguinous drainage  GI: soft, non-tender   Extremities: warm/dry, no edema  Skin: no rashes or lesions     Resolved Hospital Problem list     Assessment & Plan:   Acute coronary syndrome with significant left main disease Status post CABG x 3 on 716 Hypertension Hyperlipidemia -Management per CVTS and cardiology -Drains per CVTS, plan for pleural tube -Multimodal pain control -extubated by rapid protocol and stable -Currently on no pressors or inotropes  -continue metop, ASA and statin -SSI  Best Practice (right click and "Reselect all SmartList Selections" daily)   Diet/type: Regular consistency (see orders) DVT prophylaxis: not indicated post op resume per primary GI prophylaxis: PPI Lines: Central line Foley:  Yes, and it is still needed Code Status:  full code Last date of multidisciplinary goals of care discussion [ per primary]  Labs   CBC: Recent Labs  Lab 07/08/23 0331 07/09/23 0005 07/09/23 0805 07/09/23 1032 07/09/23 1108 07/09/23 1219 07/09/23 1600 07/09/23 1705 07/09/23 1753 07/09/23 2116 07/10/23 0421  WBC 4.5 4.4  --   --   --  5.8  --   --  6.7  --  8.0   HGB 12.6 12.7   < > 8.7*   < > 9.6* 9.5* 10.2* 10.5* 11.2* 10.3*  HCT 38.7 38.3   < > 26.9*   < > 29.5* 28.0* 30.0* 31.7* 33.0* 31.9*  MCV 97.7 98.7  --   --   --  99.0  --   --  101.6*  --  102.2*  PLT 138* 135*  --  100*  --  90*  --   --  103*  --  107*   < > = values in this interval not displayed.    Basic Metabolic Panel: Recent Labs  Lab 07/06/23 0400 07/09/23 0005 07/09/23 0805 07/09/23 0924 07/09/23 0952 07/09/23 1000 07/09/23 1108 07/09/23 1119 07/09/23 1206 07/09/23 1600 07/09/23 1705 07/09/23 1753 07/09/23 2116 07/10/23 0421  NA 136 139   < > 139   < > 138   < > 136   < > 140 140 140 139 134*  K 4.2 3.9   < > 5.1   < > 5.5*   < > 5.9*   < > 4.5 4.7 4.8 5.2* 4.5  CL 103 104   < > 105  --  107  --  108  --   --   --  106  --  101  CO2 26 25  --   --   --   --   --   --   --   --   --  26  --  24  GLUCOSE 120* 124*   < > 114*  --  97  --  106*  --   --   --  116*  --  121*  BUN 13 13   < > 11  --  10  --  11  --   --   --  10  --  10  CREATININE 0.71 0.62   < > 0.50  --  0.50  --  0.60  --   --   --  0.76  --  0.62  CALCIUM 9.1 9.5  --   --   --   --   --   --   --   --   --  9.0  --  8.8*  MG  --   --   --   --   --   --   --   --   --   --   --  3.2*  --  2.3   < > = values in this interval not displayed.   GFR: Estimated Creatinine Clearance: 65.4 mL/min (by C-G formula based on SCr of 0.62 mg/dL). Recent Labs  Lab 07/09/23 0005 07/09/23 1219 07/09/23 1753 07/10/23 0421  WBC 4.4 5.8 6.7 8.0    Liver Function Tests: No results for input(s): "AST", "ALT", "ALKPHOS", "BILITOT", "PROT", "ALBUMIN" in the last 168 hours. No results for input(s): "LIPASE", "AMYLASE" in the last 168 hours. No results for input(s): "AMMONIA" in the last 168 hours.  ABG    Component Value Date/Time   PHART 7.265 (L) 07/09/2023 2116  PCO2ART 55.6 (H) 07/09/2023 2116   PO2ART 121 (H) 07/09/2023 2116   HCO3 25.3 07/09/2023 2116   TCO2 27 07/09/2023 2116   ACIDBASEDEF 2.0  07/09/2023 2116   O2SAT 98 07/09/2023 2116     Coagulation Profile: Recent Labs  Lab 07/07/23 1642 07/09/23 1219  INR 1.0 1.3*    Cardiac Enzymes: No results for input(s): "CKTOTAL", "CKMB", "CKMBINDEX", "TROPONINI" in the last 168 hours.  HbA1C: Hgb A1c MFr Bld  Date/Time Value Ref Range Status  07/07/2023 04:42 PM 6.1 (H) 4.8 - 5.6 % Final    Comment:    (NOTE) Pre diabetes:          5.7%-6.4%  Diabetes:              >6.4%  Glycemic control for   <7.0% adults with diabetes     CBG: Recent Labs  Lab 07/09/23 1703 07/09/23 1805 07/09/23 1956 07/10/23 0005 07/10/23 0420  GLUCAP 100* 123* 122* 107* 125*    Review of Systems:   Please see the history of present illness. All other systems reviewed and are negative     Past Medical History:  She,  has no past medical history on file.   Surgical History:   Past Surgical History:  Procedure Laterality Date   CORONARY ARTERY BYPASS GRAFT N/A 07/09/2023   Procedure: CORONARY ARTERY BYPASS GRAFTING (CABG) x 3 USING LEFT INTERNAL MAMMARY ARTERY AND ENDOSCOPICALY HARVESTED RIGHT GREATER SAPHENOUS VEIN;  Surgeon: [name], MD;  Location: [facility] OR;  Service: Open Heart Surgery;  Laterality: N/A;   LEFT HEART CATH AND CORONARY ANGIOGRAPHY N/A 07/05/2023   Procedure: LEFT HEART CATH AND CORONARY ANGIOGRAPHY;  Surgeon: Swazil[place], Peter M, MD;  Location: [facility] INVASIVE CV LAB;  Service: Cardiovascular;  Laterality: N/A;   TEE WITHOUT CARDIOVERSION N/A 07/09/2023   Procedure: TRANSESOPHAGEAL ECHOCARDIOGRAM;  Surgeon: [name], MD;  Location: [facility] OR;  Service: Open Heart Surgery;  Laterality: N/A;     Social History:   reports that she has been smoking cigarettes. She started smoking about 41 years ago. She has a 20.8 pack-year smoking history. She does not have any smokeless tobacco history on file.   Family History:  Her family history is not on file.   Allergies Allergies  Allergen Reactions   No Known  Allergies     Per patient      Home Medications  Prior to Admission medications   Not on File     Critical care time: n/a     Darcella Gasman Sumedh Shinsato, PA-C [facility] See Amion for pager If no response to pager , please call [phone] until 7pm After 7:00 pm call Elink  [phone]

## 2023-07-10 NOTE — Progress Notes (Signed)
Heart Failure Navigator Progress Note  Assessed for Heart & Vascular TOC clinic readiness.  Patient does not meet criteria due to EF on intra-op 60% or greater. .   Navigator will sign off at this time.   Rhae Hammock, BSN, Scientist, clinical (histocompatibility and immunogenetics) Only

## 2023-07-11 ENCOUNTER — Inpatient Hospital Stay (HOSPITAL_COMMUNITY): Payer: Managed Care, Other (non HMO)

## 2023-07-11 DIAGNOSIS — I214 Non-ST elevation (NSTEMI) myocardial infarction: Secondary | ICD-10-CM | POA: Diagnosis not present

## 2023-07-11 LAB — BPAM RBC
Blood Product Expiration Date: 202408012359
Blood Product Expiration Date: 202408072359
Unit Type and Rh: 600
Unit Type and Rh: 600

## 2023-07-11 LAB — GLUCOSE, CAPILLARY
Glucose-Capillary: 113 mg/dL — ABNORMAL HIGH (ref 70–99)
Glucose-Capillary: 142 mg/dL — ABNORMAL HIGH (ref 70–99)
Glucose-Capillary: 106 mg/dL — ABNORMAL HIGH (ref 70–99)
Glucose-Capillary: 105 mg/dL — ABNORMAL HIGH (ref 70–99)

## 2023-07-11 LAB — TYPE AND SCREEN
ABO/RH(D): A NEG
Antibody Screen: NEGATIVE
Unit division: 0
Unit division: 0

## 2023-07-11 LAB — CBC
HCT: 30.3 % — ABNORMAL LOW (ref 36.0–46.0)
Hemoglobin: 9.7 g/dL — ABNORMAL LOW (ref 12.0–15.0)
MCH: 32.2 pg (ref 26.0–34.0)
MCHC: 32 g/dL (ref 30.0–36.0)
MCV: 100.7 fL — ABNORMAL HIGH (ref 80.0–100.0)
Platelets: 105 10*3/uL — ABNORMAL LOW (ref 150–400)
RBC: 3.01 MIL/uL — ABNORMAL LOW (ref 3.87–5.11)
RDW: 12.6 % (ref 11.5–15.5)
WBC: 6.6 10*3/uL (ref 4.0–10.5)
nRBC: 0 % (ref 0.0–0.2)

## 2023-07-11 LAB — BASIC METABOLIC PANEL
Anion gap: 8 (ref 5–15)
BUN: 8 mg/dL (ref 6–20)
CO2: 26 mmol/L (ref 22–32)
Calcium: 8.9 mg/dL (ref 8.9–10.3)
Chloride: 95 mmol/L — ABNORMAL LOW (ref 98–111)
Creatinine, Ser: 0.72 mg/dL (ref 0.44–1.00)
GFR, Estimated: >60 mL/min (ref >60)
Glucose, Bld: 112 mg/dL — ABNORMAL HIGH (ref 70–99)
Potassium: 4.4 mmol/L (ref 3.5–5.1)
Sodium: 129 mmol/L — ABNORMAL LOW (ref 135–145)

## 2023-07-11 MED ORDER — SODIUM CHLORIDE 0.9% FLUSH
3.0000 mL | Freq: Two times a day (BID) | INTRAVENOUS | Status: DC
  Administered 2023-07-11: 3 mL via INTRAVENOUS

## 2023-07-11 MED ORDER — SODIUM CHLORIDE 0.9% FLUSH: 3.0000 mL | INTRAVENOUS | Status: DC | PRN

## 2023-07-11 MED ORDER — CLOPIDOGREL BISULFATE 75 MG PO TABS
75.0000 mg | ORAL_TABLET | Freq: Every day | ORAL | Status: DC
  Administered 2023-07-11 – 2023-07-13 (×3): 75 mg via ORAL
  Filled 2023-07-11 (×3): qty 1

## 2023-07-11 MED ORDER — EMPAGLIFLOZIN 10 MG PO TABS
10.0000 mg | ORAL_TABLET | Freq: Every day | ORAL | Status: DC
  Administered 2023-07-12 – 2023-07-13 (×2): 10 mg via ORAL
  Filled 2023-07-11 (×2): qty 1

## 2023-07-11 MED ORDER — FUROSEMIDE 40 MG PO TABS
40.0000 mg | ORAL_TABLET | Freq: Every day | ORAL | Status: DC
  Administered 2023-07-11 – 2023-07-12 (×2): 40 mg via ORAL
  Filled 2023-07-11 (×2): qty 1

## 2023-07-11 MED ORDER — SPIRONOLACTONE 12.5 MG HALF TABLET
12.5000 mg | ORAL_TABLET | Freq: Every day | ORAL | Status: DC
  Administered 2023-07-11 – 2023-07-13 (×3): 12.5 mg via ORAL
  Filled 2023-07-11 (×3): qty 1

## 2023-07-11 MED ORDER — LOSARTAN POTASSIUM 25 MG PO TABS
25.0000 mg | ORAL_TABLET | Freq: Every day | ORAL | Status: DC
  Administered 2023-07-11 – 2023-07-13 (×3): 25 mg via ORAL
  Filled 2023-07-11 (×3): qty 1

## 2023-07-11 MED ORDER — ASPIRIN 81 MG PO TBEC
81.0000 mg | DELAYED_RELEASE_TABLET | Freq: Every day | ORAL | Status: DC
  Administered 2023-07-11 – 2023-07-13 (×3): 81 mg via ORAL
  Filled 2023-07-11 (×3): qty 1

## 2023-07-11 MED ORDER — ~~LOC~~ CARDIAC SURGERY, PATIENT & FAMILY EDUCATION: Freq: Once | Status: AC

## 2023-07-11 MED ORDER — SODIUM CHLORIDE 0.9 % IV SOLN: 250.0000 mL | INTRAVENOUS | Status: DC | PRN

## 2023-07-11 NOTE — Discharge Summary (Signed)
Physician Discharge Summary  Patient ID: Zurri Rudden MRN: [id number] DOB/AGE: 03-27-1966 57 y.o.  Admit date: 07/05/2023 Discharge date: 07/13/2023  Admission Diagnoses:  Coronary artery disease Acute NSTEMI  Discharge Diagnoses:   Coronary artery disease Acute NSTEMI S/P CABG x 3   Discharged Condition: stable  History of Present Illness:     Ms. Hopf is a 57 year old female who presented to [facility] ED on 07/12 reporting symptoms of chest heaviness with pain in bilateral arms and shoulders.  She states she did not go to the ED due to these symptoms but instead because she had a bump on her face that she was concerned about and needed blood work for. While in the ED she was noted to be hypertensive with systolic pressures up to the 200s. She does report increased fatigue recently, her symptoms improve with rest. Her troponin peaked at 1.2 and she was ruled in for NSTEMI. She was transferred to [facility] and underwent a cardiac catheterization on 07/12 which showed complex distal left main stenosis involving the origin of LAD, high diagonal, ramus intermediate and left circumflex as well as moderate LV dysfunction with anterior and apical wall motion abnormality and a mildly elevated LVEDP. Her echocardiogram on 07/13 showed LVEF 35-40% with grade I diastolic dysfunction and severe akinesis of the left ventricular, mid-apical anteroseptal wall, anterior wall, apical segment and inferior wall.   Dr. Cliffton Asters reviewed the patient's diagnostic studies and determined she would benefit from surgical intervention. He reviewed the treatment options as well as the risks and benefits. Ms. Fenstermacher was agreeable to proceed with surgery.  Hospital Course: Ms. Alamillo was admitted to [facility] on 07/12 and remained in stable condition until she was brought to the operating room on 07/16. She underwent CABG x 3 utilizing LIMA to LAD, SVG to Diagonal and SVG to Ramus as well as  endoscopic harvest of the right greater saphenous vein. She tolerated the procedure well and was transferred to the SICU in stable condition. She remained hemodynamically stable and required no inotropic support. She was weaned from the mechanical ventilator and extubated by 4pm on the day of surgery. BiPAP was used for a few hours for respiratory acidosis but she did not tolerate this well due to claustrophobia. She was transitioned to nasal cannula O2 and the acidosis gradually corrected. Monitoring lines, pacer wires, and chest tubes were removed routinely and she was mobilized. She was ready for transfer to the Progressive Care unit on post op day 2. Mobility and pulmonary hygiene were encouraged.  Diet was advanced and well tolerated. She had expected acute blood loss anemia and thrombocytopenia that required no transfusion Blood counts were monitored. She was transferred to 4E Progressive Care on post op day 2 where she continued to make a progressive and satisfactory recovery.  She remained in stable sinus rhythm. She regained independence with her mobility. The incision were healing with no sign of complication.   She was ready for discharge on post-op day 4.        Consults: pulmonary/intensive care  Significant Diagnostic Studies:   Left Main  Mid LM to Dist LM lesion is 70% stenosed.  Dist LM to Prox LAD lesion is 70% stenosed.    Left Anterior Descending    First Diagonal Branch  1st Diag lesion is 70% stenosed.    Ramus Intermedius  Ramus lesion is 80% stenosed.    Left Circumflex  Ost Cx to Prox Cx lesion is 60%  stenosed.    Right Coronary Artery  Vessel was injected. Vessel is normal in caliber. Vessel is angiographically normal.    Intervention   No interventions have been documented.   Wall Motion              Left Heart  Left Ventricle LV end diastolic pressure is mildly elevated. The left ventricular ejection fraction is 35-45% by visual estimate.    Coronary Diagrams  Diagnostic Dominance: Right      Treatments:  07/09/2023 Patient:  Isabella Stalling Pre-Op Dx: NSTEMI LM CAD HTN DM HLP    Post-op Dx:  same Procedure: CABG X 3.  LIMA LAD, RSVG RI, Diag   Endoscopic greater saphenous vein harvest on the right     Surgeon and Role:      * Lightfoot, Eliezer Lofts, MD - Primary    * B. Stehler , PA-C - assisting An experienced assistant was required given the complexity of this surgery and the standard of surgical care. The assistant was needed for exposure, dissection, suctioning, retraction of delicate tissues and sutures, instrument exchange and for overall help during this procedure.     Anesthesia  general EBL:  Blood Administration: none Xclamp Time:  41 min Pump Time:    Drains: 19 F blake drain:  L, mediastinal  Wires: ventricular Counts: correct     Indications: 57yo female with LM disease, and reduced LV function. She has no significant valvular disease.   She was ruled in for a NSTEMI.   Findings: Improved LV function of pre-CABG TEE.  Small LIMA.  Good vein, good distal targets  Discharge Exam: Blood pressure (!) 107/51, pulse 93, temperature 98.7 F (37.1 C), temperature source Oral, resp. rate 20, height 5\' 4"  (1.626 m), weight 57.7 kg, SpO2 92%. General appearance: alert, cooperative, no distress Neurologic: intact Heart: RRR, no significant arryhthmias Lungs: clear breath sounds with good resp effort. Stable sats. On RA.  Abdomen: soft, no tenderness Extremities: no peripheral edema, all well perfused.  Wound: The sternotomy incision and RLE EVH incision are  intact and dry  Disposition: Discharged to home in stable condition   Discharge Instructions     Amb Referral to Cardiac Rehabilitation   Complete by: As directed    Diagnosis: CABG   CABG X ___: 3   After initial evaluation and assessments completed: Virtual Based Care may be provided alone or in conjunction with  Phase 2 Cardiac Rehab based on patient barriers.: Yes   Intensive Cardiac Rehabilitation (ICR) [facility] location only OR Traditional Cardiac Rehabilitation (TCR) *If criteria for ICR are not met will enroll in TCR [facility] only): Yes      Allergies as of 07/13/2023       Reactions   No Known Allergies    Per patient         Medication List     TAKE these medications    acetaminophen 500 MG tablet Commonly known as: TYLENOL Take 1 tablet (500 mg total) by mouth every 6 (six) hours as needed.   aspirin EC 81 MG tablet Take 1 tablet (81 mg total) by mouth daily. Swallow whole. Start taking on: July 14, 2023   clopidogrel 75 MG tablet Commonly known as: PLAVIX Take 1 tablet (75 mg total) by mouth daily. Start taking on: July 14, 2023   empagliflozin 10 MG Tabs tablet Commonly known as: JARDIANCE Take 1 tablet (10 mg total) by mouth daily. Start taking on: July 14, 2023  losartan 25 MG tablet Commonly known as: COZAAR Take 1 tablet (25 mg total) by mouth daily. Start taking on: July 14, 2023   metoprolol succinate 25 MG 24 hr tablet Commonly known as: TOPROL-XL Take 1 tablet (25 mg total) by mouth daily. Start taking on: July 14, 2023   rosuvastatin 20 MG tablet Commonly known as: CRESTOR Take 1 tablet (20 mg total) by mouth daily. Start taking on: July 14, 2023   spironolactone 25 MG tablet Commonly known as: ALDACTONE Take 0.5 tablets (12.5 mg total) by mouth daily. Start taking on: July 14, 2023   traMADol 50 MG tablet Commonly known as: ULTRAM Take 1 tablet (50 mg total) by mouth every 6 (six) hours as needed for up to 7 days for moderate pain.               Durable Medical Equipment  (From admission, onward)           Start     Ordered   07/12/23 1413  For home use only DME 4 wheeled rolling walker with seat  Once       Comments: S/p CABG  Question:  Patient needs a walker to treat with the following condition  Answer:  Physical deconditioning    07/12/23 1412            Follow-up Information     Carmina Miller, DO Follow up.   Specialty: Internal Medicine Why: TIME : 1:30 PM  DATE : AUG 05 , 2024 LOCATION :[facility], [facility] FLOOR [address] [place], [place] [address] Contact information: [address] [place] [address] [phone]         [facility] Follow up.   Why: (Adapt)- rollator arranged- to be delivered to room prior to discharge. Contact information: [address] [place] [address] [phone]         Physicians, [facility] Family Follow up.   Specialty: Family Medicine Why: call to establish with another provider in the office Contact information: [address] [place] [address] [phone]         Leone Brand, NP. Go on 07/29/2023.   Specialties: Cardiology, Radiology Why: Your appointment is at 1:30pm. Contact information: [address] [place] [address] [phone]         [facility] [place]. Go on 07/31/2023.   Specialty: Cardiology Why: Your appointment with Wallis Bamberg, NP  is at 10:05am Contact information: [address] [place] [address] [phone]        [name], MD Follow up on 08/02/2023.   Specialty: Cardiothoracic Surgery Why: This is a virtual (phone call) visit. Do not go to the office. Dr. Cliffton Asters will call you around 2:50pm. Contact information: [address] [place] [address] [phone]                 The patient has been discharged on:   1.Beta Blocker:  Yes [ x ]                              No   [   ]                              If No, reason:  2.Ace Inhibitor/ARB: Yes [ x ]  No  [    ]                                     If No, reason:  3.Statin:   Yes [ x  ]                  No  [   ]                  If No, reason:  4.Ecasa:  Yes  [  x ]                  No    [   ]                  If No, reason:  5. ACS on Admission?  Yes  P2Y12 Inhibitor:  Yes  [ x  ]                                No  [  ]    Signed: Leary Roca, PA-C 07/13/2023, 10:29 AM

## 2023-07-11 NOTE — Progress Notes (Signed)
Mobility Specialist Progress Note:   07/11/23 1600  Mobility  Activity Ambulated with assistance in hallway  Level of Assistance Contact guard assist, steadying assist  Assistive Device Front wheel walker  Distance Ambulated (ft) 200 ft  Activity Response Tolerated well  Mobility Referral Yes  $Mobility charge 1 Mobility  Mobility Specialist Start Time (ACUTE ONLY) 1559  Mobility Specialist Stop Time (ACUTE ONLY) 1610  Mobility Specialist Time Calculation (min) (ACUTE ONLY) 11 min    Pre Mobility: 87 HR During Mobility: 93 HR Post Mobility:  84 HR  Pt received in bed, agreeable to mobility. Mod I for bed mobility and STS. Ambulated in hallway w/ RW and contact guard. Experienced fatigue and LOB x1 requiring x1 standing rest break. Pt left in bed with call bell and all needs met.  D'Vante Earlene Plater Mobility Specialist Please contact via Special educational needs teacher or Rehab office at [phone]

## 2023-07-11 NOTE — Progress Notes (Signed)
   Patient Name: Joanne Garner Date of Encounter: 07/11/2023 [facility] Cardiologist: None   Interval Summary  .    Patient seen post op day 2. Feeling much better. Has ambulated. No dyspnea.   Vital Signs .    Vitals:   07/11/23 0500 07/11/23 0600 07/11/23 0700 07/11/23 0808  BP: 125/66 111/76 121/63   Pulse: 73  74   Resp: 19 19 16    Temp:    99.6 F (37.6 C)  TempSrc:    Oral  SpO2: 97%  90%   Weight: 61.8 kg     Height:        Intake/Output Summary (Last 24 hours) at 07/11/2023 0815 Last data filed at 07/11/2023 0700 Gross per 24 hour  Intake 585.67 ml  Output 1500 ml  Net -914.33 ml      07/11/2023    5:00 AM 07/10/2023    5:00 AM 07/09/2023    7:09 AM  Last 3 Weights  Weight (lbs) 136 lb 3.9 oz 136 lb 11 oz 133 lb  Weight (kg) 61.8 kg 62 kg 60.328 kg      Telemetry/ECG    Sinus rhythm - Personally Reviewed  Physical Exam .   GEN: No acute distress.   Neck: No JVD Cardiac: RRR, no murmurs, rubs, or gallops. Chest tube in place. Surgical dressing clean Respiratory: Clear to auscultation bilaterally. GI: Soft, nontender, non-distended  MS: No edema  Assessment & Plan .     NSTEMI/CAD  Ms. Kropp presented to [place] ED on 07/05/23 reporting symptoms of nonspecific chest heaviness with pain in bilateral arms/shoulders, was found with mildly elevated troponin. She was transferred to [facility] for LHC and found with complex distal left main stenosis, involving origin of LAD, high diagonal, ramus intermediate and Lcx. CTS was consulted and she is s/p CABG yesterday  Continue ASA 81mg  Continue rosuvastatin 20mg   Continue metoprolol Add Plavix for ACS indication when OK with CT surgery  2. Acute systolic CHF  TTE this admission with LVEF 35-40%, severe akinesis of LV mid-apical anteroseptal wall, anterior wall, apical segment, and inferior wall.  Patient euvolemic on exam. Right heart pressures good. On no pressors Continue metoprolol  As BP  tolerates would add low dose ARB and/or aldactone   Entresto is too expensive. Could check on patient assistance as outpatient.  Also consider SGLT2 - Jardiance needs Prior auth. Will fill out forms.   3. Hypertension  BP  well controlled this admission.   4. Hyperlipidemia  Lipid panel at [facility] found total cholesterol 212, LDL 118, HDL 73. Continue Crestor 20mg .   For questions or updates, please contact [facility] Please consult [email or web address] for contact info under        Signed, Deaire Mcwhirter [place], MD

## 2023-07-11 NOTE — Progress Notes (Signed)
      [address]       [place] [address]             [phone]                 2 Days Post-Op Procedure(s) (LRB): CORONARY ARTERY BYPASS GRAFTING (CABG) x 3 USING LEFT INTERNAL MAMMARY ARTERY AND ENDOSCOPICALY HARVESTED RIGHT GREATER SAPHENOUS VEIN (N/A) TRANSESOPHAGEAL ECHOCARDIOGRAM (N/A)   Events: No events _______________________________________________________________ Vitals: BP 121/63   Pulse 74   Temp 99.6 F (37.6 C) (Oral)   Resp 16   Ht 5\' 4"  (1.626 m)   Wt 61.8 kg   SpO2 90%   BMI 23.39 kg/m  Filed Weights   07/09/23 0709 07/10/23 0500 07/11/23 0500  Weight: 60.3 kg 62 kg 61.8 kg     - Neuro: alert NAD  - Cardiovascular: sinus  Drips: none.   CVP:  [5 mmHg-6 mmHg] 6 mmHg  - Pulm: EWOB    ABG    Component Value Date/Time   PHART 7.265 (L) 07/09/2023 2116   PCO2ART 55.6 (H) 07/09/2023 2116   PO2ART 121 (H) 07/09/2023 2116   HCO3 25.3 07/09/2023 2116   TCO2 27 07/09/2023 2116   ACIDBASEDEF 2.0 07/09/2023 2116   O2SAT 98 07/09/2023 2116    - Abd: ND - Extremity: warm  .Intake/Output      07/17 0701 07/18 0700 07/18 0701 07/19 0700   P.O. 237    I.V. (mL/kg)     IV Piggyback 348.7    Total Intake(mL/kg) 585.7 (9.5)    Urine (mL/kg/hr) 1395 (0.9)    Emesis/NG output     Blood     Chest Tube 160    Total Output 1555    Net -969.3         Urine Occurrence 1 x       _______________________________________________________________ Labs:    Latest Ref Rng & Units 07/11/2023    4:59 AM 07/10/2023    4:49 PM 07/10/2023    4:21 AM  CBC  WBC 4.0 - 10.5 K/uL 6.6  7.6  8.0   Hemoglobin 12.0 - 15.0 g/dL 9.7  9.9  07.3   Hematocrit 36.0 - 46.0 % 30.3  30.8  31.9   Platelets 150 - 400 K/uL 105  104  107       Latest Ref Rng & Units 07/11/2023    4:59 AM 07/10/2023    4:49 PM 07/10/2023    4:21 AM  CMP  Glucose 70 - 99 mg/dL 710  626  948   BUN 6 - 20 mg/dL 8  7  10    Creatinine 0.44 - 1.00 mg/dL 5.46  2.70  3.50    Sodium 135 - 145 mmol/L 129  132  134   Potassium 3.5 - 5.1 mmol/L 4.4  4.2  4.5   Chloride 98 - 111 mmol/L 95  98  101   CO2 22 - 32 mmol/L 26  27  24    Calcium 8.9 - 10.3 mg/dL 8.9  9.0  8.8     CXR: PV congestion  _______________________________________________________________  Assessment and Plan: POD 2 s/p CABG  Neuro: pain controlled CV: adding plavix for NSTEMI.  On BB/A/S.   Pulm: will remove CTs Renal: creat stable.  diuresing GI: on diet Heme: stable ID: [email or web address] Endo: SSI Dispo: floor today   [name] 07/11/2023 7:51 AM

## 2023-07-11 NOTE — Progress Notes (Signed)
NAME:  Joanne Garner, MRN:  [id number], DOB:  May 04, 1966, LOS: 6 ADMISSION DATE:  07/05/2023, CONSULTATION DATE: 7/16 REFERRING MD: Dr. Cliffton Asters, CHIEF COMPLAINT: NSTEMI  History of Present Illness:  Patient is encephalopathic and/or intubated; therefore, history has been obtained from chart review.  57 year old female with past medical history as below, which is significant for hypertension, hyperlipidemia, and active pack per day smoker.  She presented to [facility] emergency department on 7/12 with complaints of chest heaviness and radiation to both arms.  While in the emergency department she was noted to be hypertensive with blood pressure 204/96.  Cardiac workup in the ED was significant for troponin 0.45 which increased to 1.2 and she was admitted for treatment of NSTEMI with heparin infusion.  Cardiology was consulted and the patient was ultimately transferred to [facility] for the purpose of undergoing left heart catheterization.  She was found to have complex distal left main stenosis involving the origin of the LAD, high diagonal, ramus intermedius, and left circumflex.  LV gram also concerning for Takotsubo cardiomyopathy but considering the significant coronary disease she is considered a candidate for thoracic surgery evaluation.  Ultimately underwent coronary artery bypass grafting x 3 using internal mammary and right greater saphenous vein on 7/16.  Postoperatively she was transferred to the ICU for recovery PCCM consulted for vent management.  Pertinent  Medical History   has no past medical history on file.   Significant Hospital Events: Including procedures, antibiotic start and stop dates in addition to other pertinent events   7/12 admitted to [facility] with NSTEMI and transferred to [facility] for left heart cath. 7/16 underwent CABG x 3 7/17 extubated, not requiring pressors or inotropes 7/18 transferred out of ICU  Interim History / Subjective:   Improving,  lines and chest tube out, transferring out of ICU  Objective   Blood pressure 121/63, pulse 74, temperature 99.6 F (37.6 C), temperature source Oral, resp. rate 16, height 5\' 4"  (1.626 m), weight 61.8 kg, SpO2 90%. CVP:  [6 mmHg] 6 mmHg      Intake/Output Summary (Last 24 hours) at 07/11/2023 0817 Last data filed at 07/11/2023 0700 Gross per 24 hour  Intake 585.67 ml  Output 1500 ml  Net -914.33 ml   Filed Weights   07/09/23 0709 07/10/23 0500 07/11/23 0500  Weight: 60.3 kg 62 kg 61.8 kg   General:  thin F, resting in bed in no acute distress HEENT: MM pink/moist Neuro: alert and oriented  CV: s1s2 rrr, no m/r/g PULM:  clear bilaterally on [place], chest tubes removed GI: soft, non-tender   Extremities: warm/dry, no edema  Skin: no rashes or lesions     Resolved Hospital Problem list     Assessment & Plan:   Acute coronary syndrome with significant left main disease Status post CABG x 3 on 716 Hypertension Hyperlipidemia -Management per CVTS and cardiology -drains and lines removed -ambulating and eating -Multimodal pain control -No pressors or inotropes  -continue metop, ASA and statin -SSI -stable for transfer out of ICU today  Best Practice (right click and "Reselect all SmartList Selections" daily)   Diet/type: Regular consistency (see orders) DVT prophylaxis: not indicated post op resume per primary GI prophylaxis: PPI Lines: Central line Foley:  N/A Code Status:  full code Last date of multidisciplinary goals of care discussion [ per primary]  Labs   CBC: Recent Labs  Lab 07/09/23 1219 07/09/23 1600 07/09/23 1753 07/09/23 2116 07/10/23 0421 07/10/23 1649 07/11/23 0459  WBC 5.8  --  6.7  --  8.0 7.6 6.6  HGB 9.6*   < > 10.5* 11.2* 10.3* 9.9* 9.7*  HCT 29.5*   < > 31.7* 33.0* 31.9* 30.8* 30.3*  MCV 99.0  --  101.6*  --  102.2* 100.3* 100.7*  PLT 90*  --  103*  --  107* 104* 105*   < > = values in this interval not displayed.    Basic  Metabolic Panel: Recent Labs  Lab 07/09/23 0005 07/09/23 0805 07/09/23 1119 07/09/23 1206 07/09/23 1753 07/09/23 2116 07/10/23 0421 07/10/23 1649 07/11/23 0459  NA 139   < > 136   < > 140 139 134* 132* 129*  K 3.9   < > 5.9*   < > 4.8 5.2* 4.5 4.2 4.4  CL 104   < > 108  --  106  --  101 98 95*  CO2 25  --   --   --  26  --  24 27 26   GLUCOSE 124*   < > 106*  --  116*  --  121* 126* 112*  BUN 13   < > 11  --  10  --  10 7 8   CREATININE 0.62   < > 0.60  --  0.76  --  0.62 0.54 0.72  CALCIUM 9.5  --   --   --  9.0  --  8.8* 9.0 8.9  MG  --   --   --   --  3.2*  --  2.3 2.0  --    < > = values in this interval not displayed.   GFR: Estimated Creatinine Clearance: 65.4 mL/min (by C-G formula based on SCr of 0.72 mg/dL). Recent Labs  Lab 07/09/23 1753 07/10/23 0421 07/10/23 1649 07/11/23 0459  WBC 6.7 8.0 7.6 6.6    Liver Function Tests: No results for input(s): "AST", "ALT", "ALKPHOS", "BILITOT", "PROT", "ALBUMIN" in the last 168 hours. No results for input(s): "LIPASE", "AMYLASE" in the last 168 hours. No results for input(s): "AMMONIA" in the last 168 hours.  ABG    Component Value Date/Time   PHART 7.265 (L) 07/09/2023 2116   PCO2ART 55.6 (H) 07/09/2023 2116   PO2ART 121 (H) 07/09/2023 2116   HCO3 25.3 07/09/2023 2116   TCO2 27 07/09/2023 2116   ACIDBASEDEF 2.0 07/09/2023 2116   O2SAT 98 07/09/2023 2116     Coagulation Profile: Recent Labs  Lab 07/07/23 1642 07/09/23 1219  INR 1.0 1.3*    Cardiac Enzymes: No results for input(s): "CKTOTAL", "CKMB", "CKMBINDEX", "TROPONINI" in the last 168 hours.  HbA1C: Hgb A1c MFr Bld  Date/Time Value Ref Range Status  07/07/2023 04:42 PM 6.1 (H) 4.8 - 5.6 % Final    Comment:    (NOTE) Pre diabetes:          5.7%-6.4%  Diabetes:              >6.4%  Glycemic control for   <7.0% adults with diabetes     CBG: Recent Labs  Lab 07/10/23 1629 07/10/23 1951 07/10/23 2325 07/11/23 0326 07/11/23 0805  GLUCAP  141* 101* 127* 113* 142*    Review of Systems:   Please see the history of present illness. All other systems reviewed and are negative     Past Medical History:  She,  has no past medical history on file.   Surgical History:   Past Surgical History:  Procedure Laterality Date   CORONARY ARTERY BYPASS GRAFT  N/A 07/09/2023   Procedure: CORONARY ARTERY BYPASS GRAFTING (CABG) x 3 USING LEFT INTERNAL MAMMARY ARTERY AND ENDOSCOPICALY HARVESTED RIGHT GREATER SAPHENOUS VEIN;  Surgeon: [name], MD;  Location: [facility] OR;  Service: Open Heart Surgery;  Laterality: N/A;   LEFT HEART CATH AND CORONARY ANGIOGRAPHY N/A 07/05/2023   Procedure: LEFT HEART CATH AND CORONARY ANGIOGRAPHY;  Surgeon: Swaziland, Peter M, MD;  Location: [facility] INVASIVE CV LAB;  Service: Cardiovascular;  Laterality: N/A;   TEE WITHOUT CARDIOVERSION N/A 07/09/2023   Procedure: TRANSESOPHAGEAL ECHOCARDIOGRAM;  Surgeon: [name], MD;  Location: [facility] OR;  Service: Open Heart Surgery;  Laterality: N/A;     Social History:   reports that she has been smoking cigarettes. She started smoking about 41 years ago. She has a 20.8 pack-year smoking history. She does not have any smokeless tobacco history on file.   Family History:  Her family history is not on file.   Allergies Allergies  Allergen Reactions   No Known Allergies     Per patient      Home Medications  Prior to Admission medications   Not on File     Critical care time: n/a     Darcella Gasman Abbye Lao, PA-C [facility] See Amion for pager If no response to pager , please call [phone] until 7pm After 7:00 pm call Elink  [phone]

## 2023-07-11 NOTE — Discharge Instructions (Signed)
Discharge Instructions:  1. You may shower, please wash incisions daily with soap and water and keep dry.  If you wish to cover wounds with dressing you may do so but please keep clean and change daily.  No tub baths or swimming until incisions have completely healed.  If your incisions become red or develop any drainage please call our office at [phone]  2. No Driving until cleared by Dr. Lightfoot's office and you are no longer using narcotic pain medications  3. Monitor your weight daily.. Please use the same scale and weigh at same time... If you gain 5-10 lbs in 48 hours with associated lower extremity swelling, please contact our office at [phone]  4. Fever of 101.5 for at least 24 hours with no source, please contact our office at [phone]  5. Activity- up as tolerated, please walk at least 3 times per day.  Avoid strenuous activity, no lifting, pushing, or pulling with your arms over 8-10 lbs for a minimum of 6 weeks  6. If any questions or concerns arise, please do not hesitate to contact our office at [phone] 

## 2023-07-12 DIAGNOSIS — I214 Non-ST elevation (NSTEMI) myocardial infarction: Secondary | ICD-10-CM | POA: Diagnosis not present

## 2023-07-12 LAB — CBC
HCT: 29.1 % — ABNORMAL LOW (ref 36.0–46.0)
Hemoglobin: 9.6 g/dL — ABNORMAL LOW (ref 12.0–15.0)
MCH: 32.2 pg (ref 26.0–34.0)
MCHC: 33 g/dL (ref 30.0–36.0)
MCV: 97.7 fL (ref 80.0–100.0)
Platelets: 113 10*3/uL — ABNORMAL LOW (ref 150–400)
RBC: 2.98 MIL/uL — ABNORMAL LOW (ref 3.87–5.11)
RDW: 12.6 % (ref 11.5–15.5)
WBC: 5.4 10*3/uL (ref 4.0–10.5)
nRBC: 0 % (ref 0.0–0.2)

## 2023-07-12 LAB — GLUCOSE, CAPILLARY
Glucose-Capillary: 108 mg/dL — ABNORMAL HIGH (ref 70–99)
Glucose-Capillary: 109 mg/dL — ABNORMAL HIGH (ref 70–99)
Glucose-Capillary: 109 mg/dL — ABNORMAL HIGH (ref 70–99)
Glucose-Capillary: 118 mg/dL — ABNORMAL HIGH (ref 70–99)
Glucose-Capillary: 142 mg/dL — ABNORMAL HIGH (ref 70–99)
Glucose-Capillary: 94 mg/dL (ref 70–99)

## 2023-07-12 LAB — BASIC METABOLIC PANEL
Anion gap: 8 (ref 5–15)
BUN: 7 mg/dL (ref 6–20)
CO2: 27 mmol/L (ref 22–32)
Calcium: 8.9 mg/dL (ref 8.9–10.3)
Chloride: 100 mmol/L (ref 98–111)
Creatinine, Ser: 0.68 mg/dL (ref 0.44–1.00)
GFR, Estimated: >60 mL/min (ref >60)
Glucose, Bld: 108 mg/dL — ABNORMAL HIGH (ref 70–99)
Potassium: 3.9 mmol/L (ref 3.5–5.1)
Sodium: 135 mmol/L (ref 135–145)

## 2023-07-12 MED ORDER — INSULIN ASPART 100 UNIT/ML IJ SOLN: 0.0000 [IU] | Freq: Three times a day (TID) | INTRAMUSCULAR | Status: DC

## 2023-07-12 MED ORDER — METOPROLOL SUCCINATE ER 25 MG PO TB24
25.0000 mg | ORAL_TABLET | Freq: Every day | ORAL | Status: DC
  Administered 2023-07-13: 25 mg via ORAL
  Filled 2023-07-12: qty 1

## 2023-07-12 MED ORDER — METOPROLOL TARTRATE 12.5 MG HALF TABLET
12.5000 mg | ORAL_TABLET | Freq: Once | ORAL | Status: AC
  Administered 2023-07-12: 12.5 mg via ORAL
  Filled 2023-07-12: qty 1

## 2023-07-12 NOTE — Progress Notes (Signed)
CARDIAC REHAB PHASE I    Pt resting in bed, feeling well today. Pt reports, she is able to ambulate independently without dizziness or SOB. Has a written schedule for her walks today. Reviewed sternal precautions with mobility. Pt has good understanding of precautions and safe mobility. Post OHS education including site care, restrictions, risk factors, DME for home, home needs at discharge, heart healthy diet, sternal precautions, exercise guidelines, IS use at home and CRP2 reviewed. All questions and concerns addressed. Will refer to [facility] for CRP2. Plan for possible discharge home tomorrow. Will continue to follow.   [phone]   Woodroe Chen, RN BSN 07/12/2023 10:37 AM

## 2023-07-12 NOTE — Progress Notes (Signed)
[address]            [place] [address]          [phone]   3 Days Post-Op Procedure(s) (LRB): CORONARY ARTERY BYPASS GRAFTING (CABG) x 3 USING LEFT INTERNAL MAMMARY ARTERY AND ENDOSCOPICALY HARVESTED RIGHT GREATER SAPHENOUS VEIN (N/A) TRANSESOPHAGEAL ECHOCARDIOGRAM (N/A)  Total Length of Stay:  LOS: 7 days   Subjective: Transferred from ICU yesterday. Awake and alert. On RA. Pain controlled.  Walked in the hall yesterday.   Objective: Vital signs in last 24 hours: Temp:  [98.4 F (36.9 C)-99.6 F (37.6 C)] 98.7 F (37.1 C) (07/19 0000) Pulse Rate:  [65-87] 85 (07/19 0000) Cardiac Rhythm: Normal sinus rhythm (07/18 1900) Resp:  [17-20] 18 (07/19 0000) BP: (104-133)/(58-74) 106/63 (07/19 0000) SpO2:  [92 %-99 %] 94 % (07/19 0000)  Filed Weights   07/09/23 0709 07/10/23 0500 07/11/23 0500  Weight: 60.3 kg 62 kg 61.8 kg    Weight change:    Hemodynamic parameters for last 24 hours:    Intake/Output from previous day: 07/18 0701 - 07/19 0700 In: 294.2 [P.O.:240; I.V.:3; IV Piggyback:51.2] Out: 800 [Urine:800]  Intake/Output this shift: No intake/output data recorded.  Current Meds: Scheduled Meds:  acetaminophen  1,000 mg Oral Q6H   Or   acetaminophen (TYLENOL) oral liquid 160 mg/5 mL  1,000 mg Per Tube Q6H   aspirin EC  81 mg Oral Daily   bisacodyl  10 mg Oral Daily   Or   bisacodyl  10 mg Rectal Daily   Chlorhexidine Gluconate Cloth  6 each Topical Daily   clopidogrel  75 mg Oral Daily   docusate sodium  200 mg Oral Daily   empagliflozin  10 mg Oral Daily   furosemide  40 mg Oral Daily   insulin aspart  0-9 Units Subcutaneous Q4H   losartan  25 mg Oral Daily   metoprolol tartrate  12.5 mg Oral BID   pantoprazole  40 mg Oral Daily   rosuvastatin  20 mg Oral Daily   sodium chloride flush  10-40 mL Intracatheter Q12H   sodium chloride flush  3 mL Intravenous Q12H   sodium chloride flush  3 mL Intravenous Q12H    spironolactone  12.5 mg Oral Daily   Continuous Infusions:  sodium chloride Stopped (07/09/23 1743)   sodium chloride     sodium chloride     sodium chloride     albumin human 999 mL/hr at 07/11/23 0800   lactated ringers     lactated ringers Stopped (07/10/23 0500)   nitroGLYCERIN Stopped (07/09/23 1231)   PRN Meds:.sodium chloride, sodium chloride, albumin human, dextrose, metoprolol tartrate, midazolam, morphine injection, ondansetron (ZOFRAN) IV, mouth rinse, oxyCODONE, sodium chloride flush, sodium chloride flush, sodium chloride flush, traMADol  General appearance: alert, cooperative, no distress, and mild anxiety Neurologic: intact Heart: RRR, no significant arryhthmias Lungs: clear breath sounds with good resp effort. Stable sats. On RA.  Abdomen: soft, no tenderness, few bowel sounds Extremities: no peripheral edema, all well perfused.  Wound: The sternotomy incision is intact and dry  Lab Results: CBC: Recent Labs    07/11/23 0459 07/11/23 2339  WBC 6.6 5.4  HGB 9.7* 9.6*  HCT 30.3* 29.1*  PLT 105* 113*   BMET:  Recent Labs    07/10/23 1649 07/11/23 0459  NA 132* 129*  K 4.2 4.4  CL 98  95*  CO2 27 26  GLUCOSE 126* 112*  BUN 7 8  CREATININE 0.54 0.72  CALCIUM 9.0 8.9    CMET: Lab Results  Component Value Date   WBC 5.4 07/11/2023   HGB 9.6 (L) 07/11/2023   HCT 29.1 (L) 07/11/2023   PLT 113 (L) 07/11/2023   GLUCOSE 112 (H) 07/11/2023   CHOL 121 07/10/2023   TRIG 58 07/10/2023   HDL 49 07/10/2023   LDLCALC 60 07/10/2023   NA 129 (L) 07/11/2023   K 4.4 07/11/2023   CL 95 (L) 07/11/2023   CREATININE 0.72 07/11/2023   BUN 8 07/11/2023   CO2 26 07/11/2023   INR 1.3 (H) 07/09/2023   HGBA1C 6.1 (H) 07/07/2023      PT/INR:  Recent Labs    07/09/23 1219  LABPROT 16.5*  INR 1.3*   Radiology: No results found.   Assessment/Plan: S/P Procedure(s) (LRB): CORONARY ARTERY BYPASS GRAFTING (CABG) x 3 USING LEFT INTERNAL MAMMARY ARTERY AND  ENDOSCOPICALY HARVESTED RIGHT GREATER SAPHENOUS VEIN (N/A) TRANSESOPHAGEAL ECHOCARDIOGRAM (N/A)  -POD3 CABG x 3 for MVCAD after presenting with NSTEMI, EF 35-40%.  Stable BP and cardiac rhythm  On ASA, metoprolol, losartan, spiro,  and Crestor.   Plavix for NSTEMI on admission.  -Neuro- intact. Pain control adequate.  -PULM- good sats on RA   -HEME- mild expected acute blood loss anemia. stable  -RENAL-normal function at baseline. Wt less than 2kg positive since pre-op.   -ENDO- Pre-op A1C 6.1 pre-diabetes on no medications prior to admission.  CBG's 120's. Continue to monitor.   -DVT PPX- mobilize and add daily [place] enoxaparin.   -Anticipate discharge to home tomorrow   Leary Roca, PA-C 07/12/2023 8:04 AM

## 2023-07-12 NOTE — TOC Progression Note (Signed)
Transition of Care (TOC) - Progression Note  Donn Pierini RN, BSN Transitions of Care Unit 4E- RN Case Manager See Treatment Team for direct phone #   Patient Details  Name: Joanne Garner MRN: [id number] Date of Birth: 1966/01/22  Transition of Care [facility]) CM/SW Contact  Zenda Alpers Lenn Sink, RN Phone Number: 07/12/2023, 2:14 PM  Clinical Narrative:    Per Cardiac Rehab- pt requesting rollator for home, pt also asking about getting Ensure for home.   CM spoke with pt at bedside pt voiced she does not have a preference for DME provider, CM provided coupons for Ensure.  Pt also asking about previous CM- A. Mariea Stable- who was going to assist with FMLA paperwork- pt provided info that CM needed- info has been forwarded.   Per pt she used to have a PCP at [facility]- but provider left and she has not established with a new one, a f/u appointment as been made w/ IM in the interim until pt can re-establish with [facility].   Call made to [employer] for DME need- rollator to be delivered to room prior to discharge  Pt is working on arranging transport for tomorrow.    Expected Discharge Plan: Home/Self Care Barriers to Discharge: Continued Medical Work up  Expected Discharge Plan and Services   Discharge Planning Services: Follow-up appt scheduled Post Acute Care Choice: Durable Medical Equipment Living arrangements for the past 2 months: Single Family Home                 DME Arranged: Walker rolling with seat DME Agency: [employer] Date DME Agency Contacted: 07/12/23 Time DME Agency Contacted: 1413 Representative spoke with at DME Agency: Beckie Salts HH Arranged: NA HH Agency: NA         Social Determinants of Health (SDOH) Interventions SDOH Screenings   Food Insecurity: Food Insecurity Present (07/08/2023)  Housing: Medium Risk (07/08/2023)  Transportation Needs: Unmet Transportation Needs (07/08/2023)  Utilities: At Risk (07/08/2023)  Tobacco Use: High Risk (07/09/2023)     Readmission Risk Interventions     No data to display

## 2023-07-12 NOTE — Progress Notes (Signed)
Mobility Specialist Progress Note:   07/12/23 1200  Mobility  Activity Ambulated with assistance in hallway  Level of Assistance Contact guard assist, steadying assist  Assistive Device None  Distance Ambulated (ft) 240 ft  Activity Response Tolerated well  Mobility Referral Yes  $Mobility charge 1 Mobility  Mobility Specialist Start Time (ACUTE ONLY) 1132  Mobility Specialist Stop Time (ACUTE ONLY) 1142  Mobility Specialist Time Calculation (min) (ACUTE ONLY) 10 min    Pre Mobility: 100 HR During Mobility: 106 HR Post Mobility:  102 HR  Pt received in chair, agreeable to mobility. Reported walking independently in hallway earlier. Ambulated in hallway  w/ contact guard, no AD. Some swaying noted during session with contact guard to correct. No c/o and VSS throughout. Pt left in chair with call bell and all needs met.  D'Vante Earlene Plater Mobility Specialist Please contact via Special educational needs teacher or Rehab office at [phone]

## 2023-07-12 NOTE — Progress Notes (Signed)
Rounding Note    Patient Name: Joanne Garner Date of Encounter: 07/12/2023  [facility] Cardiologist: None   Subjective   Doing well this morning, cardiac rehab at the bedside.   Inpatient Medications    Scheduled Meds:  acetaminophen  1,000 mg Oral Q6H   Or   acetaminophen (TYLENOL) oral liquid 160 mg/5 mL  1,000 mg Per Tube Q6H   aspirin EC  81 mg Oral Daily   bisacodyl  10 mg Oral Daily   Or   bisacodyl  10 mg Rectal Daily   clopidogrel  75 mg Oral Daily   docusate sodium  200 mg Oral Daily   empagliflozin  10 mg Oral Daily   furosemide  40 mg Oral Daily   insulin aspart  0-9 Units Subcutaneous TID AC & HS   losartan  25 mg Oral Daily   metoprolol tartrate  12.5 mg Oral BID   pantoprazole  40 mg Oral Daily   rosuvastatin  20 mg Oral Daily   spironolactone  12.5 mg Oral Daily   Continuous Infusions:  sodium chloride Stopped (07/09/23 1743)   sodium chloride     sodium chloride     albumin human 999 mL/hr at 07/11/23 0800   lactated ringers     lactated ringers Stopped (07/10/23 0500)   nitroGLYCERIN Stopped (07/09/23 1231)   PRN Meds: sodium chloride, albumin human, dextrose, metoprolol tartrate, midazolam, morphine injection, ondansetron (ZOFRAN) IV, mouth rinse, oxyCODONE, traMADol   Vital Signs    Vitals:   07/11/23 2009 07/12/23 0000 07/12/23 0702 07/12/23 0824  BP: 123/65 106/63  133/71  Pulse:  85  100  Resp: 20 18  20   Temp: 98.8 F (37.1 C) 98.7 F (37.1 C)  99.3 F (37.4 C)  TempSrc: Oral Oral  Oral  SpO2:  94%  95%  Weight:   59.7 kg   Height:        Intake/Output Summary (Last 24 hours) at 07/12/2023 0932 Last data filed at 07/11/2023 1400 Gross per 24 hour  Intake 243 ml  Output 800 ml  Net -557 ml      07/12/2023    7:02 AM 07/11/2023    5:00 AM 07/10/2023    5:00 AM  Last 3 Weights  Weight (lbs) 131 lb 9.6 oz 136 lb 3.9 oz 136 lb 11 oz  Weight (kg) 59.693 kg 61.8 kg 62 kg      Telemetry    Sinus Rhythm -  Personally Reviewed  Physical Exam   GEN: No acute distress.   Neck: No JVD Cardiac: RRR, no murmurs, rubs, or gallops.  Respiratory: Clear to auscultation bilaterally. GI: Soft, nontender, non-distended  MS: No edema; No deformity. Neuro:  Nonfocal  Psych: Normal affect   Labs    High Sensitivity Troponin:  No results for input(s): "TROPONINIHS" in the last 720 hours.   Chemistry Recent Labs  Lab 07/09/23 1753 07/09/23 2116 07/10/23 0421 07/10/23 1649 07/11/23 0459  NA 140   < > 134* 132* 129*  K 4.8   < > 4.5 4.2 4.4  CL 106  --  101 98 95*  CO2 26  --  24 27 26   GLUCOSE 116*  --  121* 126* 112*  BUN 10  --  10 7 8   CREATININE 0.76  --  0.62 0.54 0.72  CALCIUM 9.0  --  8.8* 9.0 8.9  MG 3.2*  --  2.3 2.0  --   GFRNONAA >60  --  >  60 >60 >60  ANIONGAP 8  --  9 7 8    < > = values in this interval not displayed.    Lipids  Recent Labs  Lab 07/10/23 0421  CHOL 121  TRIG 58  HDL 49  LDLCALC 60  CHOLHDL 2.5    Hematology Recent Labs  Lab 07/10/23 1649 07/11/23 0459 07/11/23 2339  WBC 7.6 6.6 5.4  RBC 3.07* 3.01* 2.98*  HGB 9.9* 9.7* 9.6*  HCT 30.8* 30.3* 29.1*  MCV 100.3* 100.7* 97.7  MCH 32.2 32.2 32.2  MCHC 32.1 32.0 33.0  RDW 12.8 12.6 12.6  PLT 104* 105* 113*   Thyroid No results for input(s): "TSH", "FREET4" in the last 168 hours.  BNPNo results for input(s): "BNP", "PROBNP" in the last 168 hours.  DDimer No results for input(s): "DDIMER" in the last 168 hours.    Assessment & Plan    NSTEMI CAD -- Joanne Garner presented to [place] ED on 07/05/23 reporting symptoms of nonspecific chest heaviness with pain in bilateral arms/shoulders, was found with mildly elevated troponin. She was transferred to [facility] for LHC and found with complex distal left main stenosis, involving origin of LAD, high diagonal, ramus intermediate and Lcx. -- Underwent CABG x3 7/16, progressing well -- continue ASA, plavix (NSTEMI), statin, switch to Toprol XL,     HFrEF -- echo  with LVEF 35-40%, severe akinesis of LV mid-apical anteroseptal wall, anterior wall, apical segment, and inferior wall. -- GDMT: switch to Toprol XL 25mg  daily, continue losartan (Entresto with high copay), jardiance and spiro 12.5mg  daily  Hypertension -- well controlled -- as above, Toprol XL, losartan and spiro    Hyperlipidemia -- Lipid panel at [facility] found total cholesterol 212, LDL 118, HDL 73.  -- Continue Crestor 20mg   PreDM -- Hgb A1c 6.1 -- as above, jardiance added   Will arrange for outpatient follow up in the office   For questions or updates, please contact [facility] Please consult [email or web address] for contact info under        Signed, Laverda Page, NP  07/12/2023, 9:32 AM

## 2023-07-12 NOTE — Progress Notes (Signed)
   07/11/23 2320  BiPAP/CPAP/SIPAP  BiPAP/CPAP/SIPAP Pt Type Adult  Reason BIPAP/CPAP not in use Non-compliant (States she does not wear bipap/cpap and does not want to wear.)

## 2023-07-12 NOTE — TOC Progression Note (Addendum)
Transition of Care [facility]) - Progression Note    Patient Details  Name: Joanne Garner MRN: [id number] Date of Birth: May 27, 1966  Transition of Care [facility]) CM/SW Contact  Elliot Cousin, RN Phone Number: [phone] 07/12/2023, 2:28 PM  Clinical Narrative:   Spoke to patient. Explained unable to arrange appt with pt PCP office in [place] (computers down). Emailed pt's HR, Bryan Lemma, # [phone] ext [phone] [email or web address] to send paperwork to surgeon's office. Fax # [phone].     Expected Discharge Plan: Home/Self Care Barriers to Discharge: Continued Medical Work up  Expected Discharge Plan and Services   Discharge Planning Services: Follow-up appt scheduled Post Acute Care Choice: Durable Medical Equipment Living arrangements for the past 2 months: Single Family Home                 DME Arranged: Walker rolling with seat DME Agency: [employer] Date DME Agency Contacted: 07/12/23 Time DME Agency Contacted: 1413 Representative spoke with at DME Agency: Beckie Salts HH Arranged: NA HH Agency: NA         Social Determinants of Health (SDOH) Interventions SDOH Screenings   Food Insecurity: Food Insecurity Present (07/08/2023)  Housing: Medium Risk (07/08/2023)  Transportation Needs: Unmet Transportation Needs (07/08/2023)  Utilities: At Risk (07/08/2023)  Tobacco Use: High Risk (07/09/2023)    Readmission Risk Interventions     No data to display

## 2023-07-13 LAB — GLUCOSE, CAPILLARY: Glucose-Capillary: 88 mg/dL (ref 70–99)

## 2023-07-13 MED ORDER — LOSARTAN POTASSIUM 25 MG PO TABS: 25.0000 mg | ORAL_TABLET | Freq: Every day | ORAL | 5 refills | Status: AC

## 2023-07-13 MED ORDER — TRAMADOL HCL 50 MG PO TABS: 50.0000 mg | ORAL_TABLET | Freq: Four times a day (QID) | ORAL | 0 refills | Status: DC | PRN

## 2023-07-13 MED ORDER — ACETAMINOPHEN 500 MG PO TABS: 500.0000 mg | ORAL_TABLET | Freq: Four times a day (QID) | ORAL | Status: AC | PRN

## 2023-07-13 MED ORDER — METOPROLOL SUCCINATE ER 25 MG PO TB24: 25.0000 mg | ORAL_TABLET | Freq: Every day | ORAL | 5 refills | Status: AC

## 2023-07-13 MED ORDER — ASPIRIN 81 MG PO TBEC: 81.0000 mg | DELAYED_RELEASE_TABLET | Freq: Every day | ORAL | 3 refills | Status: AC

## 2023-07-13 MED ORDER — LACTULOSE 10 GM/15ML PO SOLN
20.0000 g | Freq: Once | ORAL | Status: AC
  Administered 2023-07-13: 20 g via ORAL
  Filled 2023-07-13: qty 30

## 2023-07-13 MED ORDER — EMPAGLIFLOZIN 10 MG PO TABS: 10.0000 mg | ORAL_TABLET | Freq: Every day | ORAL | 5 refills | Status: AC

## 2023-07-13 MED ORDER — ROSUVASTATIN CALCIUM 20 MG PO TABS: 20.0000 mg | ORAL_TABLET | Freq: Every day | ORAL | 5 refills | Status: DC

## 2023-07-13 MED ORDER — CLOPIDOGREL BISULFATE 75 MG PO TABS: 75.0000 mg | ORAL_TABLET | Freq: Every day | ORAL | 11 refills | Status: AC

## 2023-07-13 MED ORDER — SPIRONOLACTONE 25 MG PO TABS: 12.5000 mg | ORAL_TABLET | Freq: Every day | ORAL | 5 refills | Status: AC

## 2023-07-13 NOTE — Progress Notes (Signed)
Patient given discharge instructions. PIV removed. Telemetry box removed, CCMD notified. Patient belongings packed and taken with patient in wheelchair to vehicle by staff.  Kenard Gower, RN

## 2023-07-13 NOTE — Progress Notes (Signed)
[address]            [place] [address]          [phone]   4 Days Post-Op Procedure(s) (LRB): CORONARY ARTERY BYPASS GRAFTING (CABG) x 3 USING LEFT INTERNAL MAMMARY ARTERY AND ENDOSCOPICALY HARVESTED RIGHT GREATER SAPHENOUS VEIN (N/A) TRANSESOPHAGEAL ECHOCARDIOGRAM (N/A)  Total Length of Stay:  LOS: 8 days   Subjective: Had a good day yesterday. Walked this morning.  No new concerns. Would like to go home.   Objective: Vital signs in last 24 hours: Temp:  [98 F (36.7 C)-99.8 F (37.7 C)] 98.7 F (37.1 C) (07/20 0721) Pulse Rate:  [90-103] 93 (07/20 0721) Cardiac Rhythm: Normal sinus rhythm (07/20 0733) Resp:  [16-20] 20 (07/20 0721) BP: (107-133)/(51-78) 107/51 (07/20 0721) SpO2:  [91 %-97 %] 92 % (07/20 0721) Weight:  [57.7 kg] 57.7 kg (07/20 0537)  Filed Weights   07/11/23 0500 07/12/23 0702 07/13/23 0537  Weight: 61.8 kg 59.7 kg 57.7 kg        Intake/Output from previous day: 07/19 0701 - 07/20 0700 In: 120 [P.O.:120] Out: -   Intake/Output this shift: No intake/output data recorded.  Current Meds: Scheduled Meds:  acetaminophen  1,000 mg Oral Q6H   Or   acetaminophen (TYLENOL) oral liquid 160 mg/5 mL  1,000 mg Per Tube Q6H   aspirin EC  81 mg Oral Daily   bisacodyl  10 mg Oral Daily   Or   bisacodyl  10 mg Rectal Daily   clopidogrel  75 mg Oral Daily   docusate sodium  200 mg Oral Daily   empagliflozin  10 mg Oral Daily   insulin aspart  0-9 Units Subcutaneous TID AC & HS   lactulose  20 g Oral Once   losartan  25 mg Oral Daily   metoprolol succinate  25 mg Oral Daily   pantoprazole  40 mg Oral Daily   rosuvastatin  20 mg Oral Daily   spironolactone  12.5 mg Oral Daily   Continuous Infusions:  sodium chloride Stopped (07/09/23 1743)   sodium chloride     sodium chloride     albumin human 999 mL/hr at 07/11/23 0800   lactated ringers     lactated ringers Stopped (07/10/23 0500)   nitroGLYCERIN  Stopped (07/09/23 1231)   PRN Meds:.sodium chloride, albumin human, dextrose, metoprolol tartrate, midazolam, morphine injection, ondansetron (ZOFRAN) IV, mouth rinse, oxyCODONE, traMADol  General appearance: alert, cooperative, no distress Neurologic: intact Heart: RRR, no significant arryhthmias Lungs: clear breath sounds with good resp effort. Stable sats. On RA.  Abdomen: soft, no tenderness Extremities: no peripheral edema, all well perfused.  Wound: The sternotomy incision and RLE EVH incision are  intact and dry  Lab Results: CBC: Recent Labs    07/11/23 0459 07/11/23 2339  WBC 6.6 5.4  HGB 9.7* 9.6*  HCT 30.3* 29.1*  PLT 105* 113*   BMET:  Recent Labs    07/11/23 0459 07/11/23 2339  NA 129* 135  K 4.4 3.9  CL 95* 100  CO2 26 27  GLUCOSE 112* 108*  BUN 8 7  CREATININE 0.72 0.68  CALCIUM 8.9 8.9    CMET: Lab Results  Component Value Date   WBC 5.4 07/11/2023   HGB 9.6 (L) 07/11/2023   HCT 29.1 (L) 07/11/2023   PLT 113 (L) 07/11/2023   GLUCOSE 108 (H) 07/11/2023  CHOL 121 07/10/2023   TRIG 58 07/10/2023   HDL 49 07/10/2023   LDLCALC 60 07/10/2023   NA 135 07/11/2023   K 3.9 07/11/2023   CL 100 07/11/2023   CREATININE 0.68 07/11/2023   BUN 7 07/11/2023   CO2 27 07/11/2023   INR 1.3 (H) 07/09/2023   HGBA1C 6.1 (H) 07/07/2023      PT/INR:  No results for input(s): "LABPROT", "INR" in the last 72 hours.  Radiology: No results found.   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  Stable BP and cardiac rhythm  On ASA, metoprolol, losartan, spiro, and Crestor.   Plavix for NSTEMI on admission.  -Neuro- intact. Pain control adequate.  -PULM- good sats on RA   -HEME- mild expected acute blood loss anemia.  stable  -RENAL-normal function at baseline. Wt less than 2kg positive since pre-op.   -ENDO- Pre-op A1C 6.1 pre-diabetes on no medications prior to admission.  CBG's well controlled with no coverage required  -DVT PPX- mobilize and add daily [place] enoxaparin.   -Discharge today.  Follow up arranged and instructions given.    Leary Roca, PA-C 07/13/2023 8:05 AM

## 2023-07-13 NOTE — Progress Notes (Signed)
CARDIAC REHAB PHASE I    Pt feeling well this morning. Pt ambulating independently, maintaining proper sternal precautions( observed oob to and from bathroom). Pt is hopeful for discharge home today. Post [facility] education provided yesterday. All questions and concerns addressed. Referral for CRP2 sent to [facility].      Woodroe Chen, RN BSN 07/13/2023 8:57 AM

## 2023-07-15 MED FILL — Potassium Chloride Inj 2 mEq/ML: INTRAVENOUS | Qty: 40 | Status: AC

## 2023-07-15 MED FILL — Heparin Sodium (Porcine) Inj 1000 Unit/ML: Qty: 1000 | Status: AC

## 2023-07-15 MED FILL — Lidocaine HCl Local Preservative Free (PF) Inj 2%: INTRAMUSCULAR | Qty: 14 | Status: AC

## 2023-07-16 ENCOUNTER — Telehealth: Payer: Self-pay | Admitting: *Deleted

## 2023-07-16 ENCOUNTER — Telehealth: Payer: Self-pay

## 2023-07-16 ENCOUNTER — Other Ambulatory Visit: Payer: Self-pay | Admitting: Physician Assistant

## 2023-07-16 MED ORDER — OXYCODONE HCL 5 MG PO TABS: 5.0000 mg | ORAL_TABLET | Freq: Four times a day (QID) | ORAL | 0 refills | Status: DC | PRN

## 2023-07-16 MED FILL — Mannitol IV Soln 20%: INTRAVENOUS | Qty: 500 | Status: AC

## 2023-07-16 MED FILL — Sodium Bicarbonate IV Soln 8.4%: INTRAVENOUS | Qty: 50 | Status: AC

## 2023-07-16 MED FILL — Electrolyte-R (PH 7.4) Solution: INTRAVENOUS | Qty: 3000 | Status: AC

## 2023-07-16 MED FILL — Calcium Chloride Inj 10%: INTRAVENOUS | Qty: 10 | Status: AC

## 2023-07-16 MED FILL — Sodium Chloride IV Soln 0.9%: INTRAVENOUS | Qty: 2000 | Status: AC

## 2023-07-16 NOTE — Progress Notes (Signed)
Patient called the office stating she has been taking Tylenol and Tramadol every 6 hours and it has not been helping her pain. The patient was getting Oxycodone 5-10mg  and Tylenol while in the hospital. Instructed her to discontinue her Tramadol, continue Tylenol and I will order 7 days of Oxycodone 5mg  Q6H. Also instructed her to try heat and ice.   Jenny Reichmann, PA-C

## 2023-07-16 NOTE — Telephone Encounter (Signed)
Received message from Loraine Grip RN in office. Pt needs Home Health RN and PT. Contacted [facility] rep, Bjorn Loser and [facility] for verbal orders for [facility]. They are able to accept patient and will reach out to office to have paperwork signed. Isidoro Donning RN3 CCM, Heart Failure TOC CM [phone]

## 2023-07-16 NOTE — Telephone Encounter (Signed)
Patient contacted the office requesting a change in her pain medication. She states that the Tramadol is not helping her pain and she is not sleeping at night due to pain and positioning. She is also requesting home health services.   Spoke with Fredric Mare, [place] who is going to send in Oxycodone into the patient's preferred pharmacy. Also spoke with case manager, Cathlean Cower, who was able to get patient into [facility] for home health assistance.   Called patient back to make her aware. Left voicemail message for return call.

## 2023-07-24 ENCOUNTER — Encounter: Payer: Self-pay | Admitting: Internal Medicine

## 2023-07-25 NOTE — Telephone Encounter (Signed)
Attending Physician statement completed and faxed to [phone] [employer] critical claim #[id number]. Beginning LOA 07/05/23 through 107/7/24/ DOS 07/05/23. Forms mail to home address

## 2023-07-29 ENCOUNTER — Ambulatory Visit: Payer: Managed Care, Other (non HMO) | Admitting: Student

## 2023-07-29 ENCOUNTER — Encounter: Payer: Self-pay | Admitting: Student

## 2023-07-29 VITALS — BP 109/69 | HR 96 | Temp 98.2°F | Ht 65.0 in | Wt 125.0 lb

## 2023-07-29 DIAGNOSIS — D649 Anemia, unspecified: Secondary | ICD-10-CM

## 2023-07-29 DIAGNOSIS — I5020 Unspecified systolic (congestive) heart failure: Secondary | ICD-10-CM | POA: Insufficient documentation

## 2023-07-29 DIAGNOSIS — Z951 Presence of aortocoronary bypass graft: Secondary | ICD-10-CM

## 2023-07-29 DIAGNOSIS — Z4889 Encounter for other specified surgical aftercare: Secondary | ICD-10-CM

## 2023-07-29 DIAGNOSIS — R7303 Prediabetes: Secondary | ICD-10-CM | POA: Insufficient documentation

## 2023-07-29 DIAGNOSIS — F17210 Nicotine dependence, cigarettes, uncomplicated: Secondary | ICD-10-CM

## 2023-07-29 HISTORY — DX: Anemia, unspecified: D64.9

## 2023-07-29 NOTE — Assessment & Plan Note (Signed)
Echo from 7/13 noted EF of 35-40%. Notably this is prior to her CABG x 3. Patient has cardiology appointment tomorrow, and in the meantime, patient has been taking Toprol-XL 25 mg/daily, Spironolactone 12.5 mg/daily, Jardiance 10 mg/daily, and Losartan 25 mg/daily per GDMT. -Patient to follow-up with cardiology

## 2023-07-29 NOTE — Assessment & Plan Note (Signed)
A1c on 07/07/2023 was 6.1. Patient started on Jardiance (see HFrEF) and will follow-up with her PCP.

## 2023-07-29 NOTE — Progress Notes (Signed)
CC: Follow-up  HPI:  Joanne Garner is a 57 y.o. female living with a history stated below and presents today for a follow-up s/p CABG x 3 on 07/09/2023 . Please see problem based assessment and plan for additional details.  Past Medical History:  Diagnosis Date   HFrEF (heart failure with reduced ejection fraction) (HCC)    Hx of CABG 07/09/2023   Prediabetes     Current Outpatient Medications on File Prior to Visit  Medication Sig Dispense Refill   acetaminophen (TYLENOL) 500 MG tablet Take 1 tablet (500 mg total) by mouth every 6 (six) hours as needed.     aspirin EC 81 MG tablet Take 1 tablet (81 mg total) by mouth daily. Swallow whole. 90 tablet 3   clopidogrel (PLAVIX) 75 MG tablet Take 1 tablet (75 mg total) by mouth daily. 30 tablet 11   empagliflozin (JARDIANCE) 10 MG TABS tablet Take 1 tablet (10 mg total) by mouth daily. 30 tablet 5   losartan (COZAAR) 25 MG tablet Take 1 tablet (25 mg total) by mouth daily. 30 tablet 5   metoprolol succinate (TOPROL-XL) 25 MG 24 hr tablet Take 1 tablet (25 mg total) by mouth daily. 30 tablet 5   oxyCODONE (ROXICODONE) 5 MG immediate release tablet Take 1 tablet (5 mg total) by mouth every 6 (six) hours as needed. 28 tablet 0   rosuvastatin (CRESTOR) 20 MG tablet Take 1 tablet (20 mg total) by mouth daily. 30 tablet 5   spironolactone (ALDACTONE) 25 MG tablet Take 0.5 tablets (12.5 mg total) by mouth daily. 30 tablet 5   No current facility-administered medications on file prior to visit.    Family History  Problem Relation Age of Onset   Prostate cancer Father    Leukemia Sister     Social History   Socioeconomic History   Marital status: Married    Spouse name: Not on file   Number of children: Not on file   Years of education: Not on file   Highest education level: Not on file  Occupational History   Not on file  Tobacco Use   Smoking status: Every Day    Current packs/day: 0.50    Average packs/day: 0.5  packs/day for 41.6 years (20.8 ttl pk-yrs)    Types: Cigarettes    Start date: 4   Smokeless tobacco: Not on file  Vaping Use   Vaping status: Never Used  Substance and Sexual Activity   Alcohol use: Not Currently   Drug use: Not Currently   Sexual activity: Not Currently  Other Topics Concern   Not on file  Social History Narrative   Not on file   Social Determinants of Health   Financial Resource Strain: Not on file  Food Insecurity: Food Insecurity Present (07/08/2023)   Hunger Vital Sign    Worried About Running Out of Food in the Last Year: Sometimes true    Ran Out of Food in the Last Year: Often true  Transportation Needs: Unmet Transportation Needs (07/08/2023)   PRAPARE - Administrator, Civil Service (Medical): Yes    Lack of Transportation (Non-Medical): Yes  Physical Activity: Not on file  Stress: Not on file  Social Connections: Not on file  Intimate Partner Violence: Not At Risk (07/08/2023)   Humiliation, Afraid, Rape, and Kick questionnaire    Fear of Current or Ex-Partner: No    Emotionally Abused: No    Physically Abused: No    Sexually Abused:  No    Review of Systems: ROS negative except for what is noted on the assessment and plan.  Vitals:   07/29/23 1337  BP: 109/69  Pulse: 96  Temp: 98.2 F (36.8 C)  TempSrc: Oral  SpO2: 100%  Weight: 125 lb (56.7 kg)  Height: 5\' 5"  (1.651 m)    Physical Exam: Constitutional: well-appearing, sitting in chair, in no acute distress Cardiovascular: regular rate and rhythm, no m/r/g Pulmonary/Chest: normal work of breathing on room air, lungs clear to auscultation bilaterally Skin: 12 inch midline sternal incision without surrounding erythema/drainage. Psych: normal mood and behavior  Assessment & Plan:     Patient seen with Dr. Sol Blazing  S/P CABG x 3 Patient underwent procedure on 07/09/2023 after presenting to the ED for bump on face, but work-up revealed NSTEMI with subsequent LHC.  Cardiology appointment is tomorrow. Patient states incisional pain has been bothersome but improving. Midline incision unremarkable on exam.   -Follow-up with cardiology  HFrEF (heart failure with reduced ejection fraction) (HCC) Echo from 7/13 noted EF of 35-40%. Notably this is prior to her CABG x 3. Patient has cardiology appointment tomorrow, and in the meantime, patient has been taking Toprol-XL 25 mg/daily, Spironolactone 12.5 mg/daily, Jardiance 10 mg/daily, and Losartan 25 mg/daily per GDMT. -Patient to follow-up with cardiology  Prediabetes A1c on 07/07/2023 was 6.1. Patient started on Jardiance (see HFrEF) and will follow-up with her PCP.   Anemia Patient's Hgb was 9.6 upon discharge 07/11/2023 s/p CABG x 3. 12.6 when initially presented to the ED. Suspect the drop is surgery related, but repeat CBC to determine if this has started to normalize. -Order CBC    Carmina Miller, D.O. [facility] Internal Medicine, PGY-1 Phone: [phone] Date 07/29/2023 Time 3:11 PM

## 2023-07-29 NOTE — Assessment & Plan Note (Signed)
Patient's Hgb was 9.6 upon discharge 07/11/2023 s/p CABG x 3. 12.6 when initially presented to the ED. Suspect the drop is surgery related, but repeat CBC to determine if this has started to normalize. -Order CBC

## 2023-07-29 NOTE — Patient Instructions (Signed)
Thank you for allowing me to be a part of your care team. Today we discussed a few things:  Please follow-up with your cardiology appointment tomorrow We did some blood work today (CBC) to make sure your blood counts have normalized; I will let you know when the results come in. Follow-up with your PCP regarding the screening recommendations we discussed such as the low-dose lung CT based on your smoking history.  Let me know if you have any further questions.

## 2023-07-29 NOTE — Assessment & Plan Note (Addendum)
Patient underwent procedure on 07/09/2023 after presenting to the ED for bump on face, but work-up revealed NSTEMI with subsequent LHC. Cardiology appointment is tomorrow. Patient states incisional pain has been bothersome but improving. Midline incision unremarkable on exam.   -Follow-up with cardiology

## 2023-07-30 ENCOUNTER — Encounter: Payer: Self-pay | Admitting: Cardiology

## 2023-07-30 NOTE — Progress Notes (Signed)
 " Cardiology Office Note:  .   Date:  07/30/2023  ID:  Joanne Garner, DOB [birth year], MRN [id number] PCP: Joanne Gee, DO  [facility] Providers Cardiologist:  None    History of Present Illness: .   Joanne Garner is a 57 y.o. female with a past medical history of CAD s/p CABG x 3, HFrEF, hypertension, tobacco use, anxiety.  She presented to [facility] with atypical type chest pain, troponin mildly elevated, blood pressure was elevated.  She was transferred to [facility] underwent left heart cath which revealed complex distal left main stenosis involving the origin of the LAD, high diagonal, ramus intermediate and left circumflex.  She underwent CABG x 3 on 07/09/2023 using LIMA to LAD, SVG diagonal, SVG to ramus, endoscopic harvest of the right greater saphenous vein.  She had an uncomplicated post CABG and was ultimately discharged on postop day 4.  Echo on 07/06/2023 revealed an EF of 35 to 40%, positive RWMA, grade 1 DD, trivial MR.  Plans to follow-up with Dr. Shyrl on 08/02/2023.  She presents today for follow-up after hospitalization as outlined above.  She is doing remarkably well.  She is adhering to sternal precautions, as well as her medication regimen.  Her blood pressure is marginally low today however she denies any dizziness or weakness.  Her heart rate is mildly elevated however she states she is nervous.  She is still smoking however she is cut back tremendously, offered her help with complete cessation however she wants to try and stop smoking on her own.  Her only complaint is weight loss, this has been a struggle for her in the past and she used to drink beer specifically to increase her caloric intake however she understands that that is not the best method for her and has been drinking Ensure.  She denies chest pain, palpitations, dyspnea, pnd, orthopnea, n, v, dizziness, syncope, edema, weight gain, or early satiety.   ROS: Review of  Systems  Constitutional:  Positive for weight loss.  HENT: Negative.    Eyes: Negative.   Respiratory: Negative.    Cardiovascular: Negative.   Gastrointestinal: Negative.   Genitourinary: Negative.   Musculoskeletal: Negative.   Skin: Negative.   Neurological: Negative.   Endo/Heme/Allergies: Negative.   Psychiatric/Behavioral: Negative.       Studies Reviewed: .        Cardiac Studies & Procedures   CARDIAC CATHETERIZATION  CARDIAC CATHETERIZATION 07/05/2023  Narrative CAD with complex distal left main stenosis involving origin of LAD, high diagonal, ramus intermediate and LCx Moderate LV dysfunction with anterior and apical wall motion abnormality. Mildly elevated LVEDP 19 mm Hg  Plan: initially LV angiogram appeared to be c/w Takotsubo cardiomyopathy but she does have significant complex distal left main disease. Will need CT surgery consultation. Check Echo.  Findings Coronary Findings Diagnostic  Dominance: Right  Left Main Mid LM to Dist LM lesion is 70% stenosed. Dist LM to Prox LAD lesion is 70% stenosed.  Left Anterior Descending  First Diagonal Branch 1st Diag lesion is 70% stenosed.  Ramus Intermedius Ramus lesion is 80% stenosed.  Left Circumflex Ost Cx to Prox Cx lesion is 60% stenosed.  Right Coronary Artery Vessel was injected. Vessel is normal in caliber. Vessel is angiographically normal.  Intervention  No interventions have been documented.     ECHOCARDIOGRAM  ECHOCARDIOGRAM COMPLETE 07/06/2023  Narrative ECHOCARDIOGRAM REPORT    Patient Name:   Joanne Garner Kon Date of Exam: 07/06/2023  Medical Rec #:  [id number]          Height: Accession #:    [id number]         Weight:       133.0 lb Date of Birth:  Jan 24, 1966          BSA:          1.538 m Patient Age:    57 years           BP:           125/73 mmHg Patient Gender: F                  HR:           60 bpm. Exam Location:  Inpatient  Procedure: 2D Echo, Color Doppler,  Cardiac Doppler and Intracardiac Opacification Agent  Indications:    CAD Native Vessel  History:        Patient has no prior history of Echocardiogram examinations.  Sonographer:    Logan Shove Sonographer#2:  Tinnie Barefoot RDCS Referring Phys: [id number] Joanne Garner  IMPRESSIONS   1. Sluggish apical flow with Definity  contrast, but no thrombus. Left ventricular ejection fraction, by estimation, is 35 to 40%. The left ventricle has moderately decreased function. The left ventricle demonstrates regional wall motion abnormalities (see scoring diagram/findings for description). There is mild left ventricular hypertrophy. Left ventricular diastolic parameters are consistent with Grade I diastolic dysfunction (impaired relaxation). There is severe akinesis of the left ventricular, mid-apical anteroseptal wall, anterior wall, apical segment and inferior wall. 2. Right ventricular systolic function is normal. The right ventricular size is normal. Tricuspid regurgitation signal is inadequate for assessing PA pressure. 3. The mitral valve is grossly normal. Trivial mitral valve regurgitation. 4. The aortic valve is tricuspid. Aortic valve regurgitation is not visualized. 5. The inferior vena cava is normal in size with <50% respiratory variability, suggesting right atrial pressure of 8 mmHg.  Comparison(s): No prior Echocardiogram.  Conclusion(s)/Recommendation(s): Findings suggest LAD territory ischemia/infarct.  FINDINGS Left Ventricle: Sluggish apical flow with Definity  contrast, but no thrombus. Left ventricular ejection fraction, by estimation, is 35 to 40%. The left ventricle has moderately decreased function. The left ventricle demonstrates regional wall motion abnormalities. Severe akinesis of the left ventricular, mid-apical anteroseptal wall, anterior wall, apical segment and inferior wall. Definity  contrast agent was given IV to delineate the left ventricular endocardial  borders. The left ventricular internal cavity size was normal in size. There is mild left ventricular hypertrophy. Left ventricular diastolic parameters are consistent with Grade I diastolic dysfunction (impaired relaxation). Indeterminate filling pressures.   LV Wall Scoring: The mid and distal lateral wall, mid and distal anterior septum, entire apex, mid and distal inferior wall, and mid inferoseptal segment are akinetic.  Right Ventricle: The right ventricular size is normal. No increase in right ventricular wall thickness. Right ventricular systolic function is normal. Tricuspid regurgitation signal is inadequate for assessing PA pressure.  Left Atrium: Left atrial size was normal in size.  Right Atrium: Right atrial size was normal in size.  Pericardium: There is no evidence of pericardial effusion.  Mitral Valve: The mitral valve is grossly normal. Trivial mitral valve regurgitation.  Tricuspid Valve: The tricuspid valve is grossly normal. Tricuspid valve regurgitation is trivial.  Aortic Valve: The aortic valve is tricuspid. Aortic valve regurgitation is not visualized. Aortic valve peak gradient measures 6.4 mmHg.  Pulmonic Valve: The pulmonic valve was normal in structure. Pulmonic valve regurgitation is not visualized.  Aorta:  The aortic root and ascending aorta are structurally normal, with no evidence of dilitation.  Venous: The inferior vena cava is normal in size with less than 50% respiratory variability, suggesting right atrial pressure of 8 mmHg.  IAS/Shunts: No atrial level shunt detected by color flow Doppler.   LEFT VENTRICLE PLAX 2D LVIDd:         4.00 cm      Diastology LVIDs:         2.10 cm      LV e' medial:    4.35 cm/s LV PW:         1.10 cm      LV E/e' medial:  12.8 LV IVS:        1.10 cm      LV e' lateral:   5.22 cm/s LVOT diam:     1.80 cm      LV E/e' lateral: 10.6 LV SV:         45 LV SV Index:   29 LVOT Area:     2.54 cm  LV Volumes  (MOD) LV vol d, MOD A2C: 63.9 ml LV vol d, MOD A4C: 126.9 ml LV vol s, MOD A2C: 38.4 ml LV vol s, MOD A4C: 84.5 ml LV SV MOD A2C:     25.5 ml LV SV MOD A4C:     126.9 ml LV SV MOD BP:      53.2 ml  RIGHT VENTRICLE             IVC RV Basal diam:  2.40 cm     IVC diam: 2.00 cm RV S prime:     13.10 cm/s TAPSE (M-mode): 1.6 cm  LEFT ATRIUM             Index        RIGHT ATRIUM          Index LA diam:        2.40 cm 1.56 cm/m   RA Area:     7.28 cm LA Vol (A2C):   29.8 ml 19.37 ml/m  RA Volume:   13.60 ml 8.84 ml/m LA Vol (A4C):   26.4 ml 17.16 ml/m LA Biplane Vol: 29.7 ml 19.31 ml/m AORTIC VALVE AV Area (Vmax): 1.58 cm AV Vmax:        126.00 cm/s AV Peak Grad:   6.4 mmHg LVOT Vmax:      78.00 cm/s LVOT Vmean:     49.800 cm/s LVOT VTI:       0.175 m  AORTA Ao Root diam: 2.60 cm Ao Asc diam:  2.50 cm  MITRAL VALVE MV Area (PHT): 2.87 cm    SHUNTS MV Decel Time: 264 msec    Systemic VTI:  0.18 m MV E velocity: 55.50 cm/s  Systemic Diam: 1.80 cm MV A velocity: 82.80 cm/s MV E/A ratio:  0.67  Vinie Maxcy MD Electronically signed by Vinie Maxcy MD Signature Date/Time: 07/06/2023/11:48:33 AM    Final   TEE  ECHO INTRAOPERATIVE TEE 07/09/2023  Narrative *INTRAOPERATIVE TRANSESOPHAGEAL REPORT *    Patient Name:   CHATTIE GREESON Sadek Date of Exam: 07/09/2023 Medical Rec #:  [id number]          Height:       64.0 in Accession #:    [id number]         Weight:       133.0 lb Date of Birth:  [birth year]          BSA:  1.64 m Patient Age:    57 years           BP:           116/74 mmHg Patient Gender: F                  HR:           77 bpm. Exam Location:  Anesthesiology  Transesophogeal exam was perform intraoperatively during surgical procedure. Patient was closely monitored under general anesthesia during the entirety of examination.  Indications:     CAD Native Vessel i25.10 Performing Phys: Franky Bald MD Diagnosing Phys: Franky Bald  MD  Complications: No known complications during this procedure. POST-OP IMPRESSIONS s/p CABG x3 _ Left Ventricle: LVEF unchanged, no significant RWMA's, CO stable. _ Right Ventricle: The right ventricle appears unchanged from pre-bypass. _ Aorta: Stable plaque burden, no dissection noted after cannula removed. _ Left Atrium: The left atrium appears unchanged from pre-bypass. _ Left Atrial Appendage: The left atrial appendage appears unchanged from pre-bypass. _ Aortic Valve: The aortic valve appears unchanged from pre-bypass. _ Mitral Valve: The mitral valve appears unchanged from pre-bypass. _ Tricuspid Valve: The tricuspid valve appears unchanged from pre-bypass. _ Pulmonic Valve: The pulmonic valve appears unchanged from pre-bypass. _ Interatrial Septum: The interatrial septum appears unchanged from pre-bypass. _ Interventricular Septum: The interventricular septum appears unchanged from pre-bypass. _ Pericardium: The pericardium appears unchanged from pre-bypass.  PRE-OP FINDINGS Left Ventricle: The left ventricle has normal systolic function, with an ejection fraction of 60% or greater. The cavity size was normal. No intracardiac thrombi or masses were visualized. There is mild concentric left ventricular hypertrophy. Left ventricular diastolic function was not be evaluated.   LV Wall Scoring: The inferior septum and basal anteroseptal segment are hypokinetic.   Right Ventricle: The right ventricle has mildly reduced systolic function. The cavity was normal. There is no increase in right ventricular wall thickness. There is no aneurysm seen.  Left Atrium: Left atrial size was normal in size. No left atrial/left atrial appendage thrombus was detected. Left atrial appendage velocity is normal at greater than 40 cm/s.  Right Atrium: Right atrial size was normal in size.  Interatrial Septum: No atrial level shunt detected by color flow doppler. There is no evidence of a patent  foramen ovale.  Pericardium: There is no evidence of pericardial effusion. There is no pleural effusion.  Mitral Valve: The mitral valve is normal in structure. Mitral valve regurgitation is trivial by color flow Doppler. The MR jet is centrally-directed. There is no evidence of mitral valve vegetation. There is no evidence of mitral stenosis.  Tricuspid Valve: The tricuspid valve was normal in structure. Tricuspid valve regurgitation was not visualized by color flow Doppler.  Aortic Valve: The aortic valve is tricuspid Aortic valve regurgitation was not visualized by color flow Doppler. There is no stenosis of the aortic valve. There is no evidence of aortic valve vegetation.  Pulmonic Valve: The pulmonic valve was normal in structure. Pulmonic valve regurgitation is not visualized by color flow Doppler.   Aorta: The aortic root are normal in size and structure. There is evidence of plaque in the aortic arch and ascending aorta; Grade I, measuring 1-36mm in size.  Pulmonary Artery: The pulmonary artery is of normal size.  Shunts: There is no evidence of an atrial septal defect.  +--------------+-------++ LEFT VENTRICLE        +--------------+-------++ PLAX 2D               +--------------+-------++  LVIDd:        4.30 cm +--------------+-------++ LVIDs:        2.70 cm +--------------+-------++ LV SV:        56 ml   +--------------+-------++ LV SV Index:  33.85   +--------------+-------++                       +--------------+-------++   Franky Bald MD Electronically signed by Franky Bald MD Signature Date/Time: 07/09/2023/2:52:06 PM    Final            Risk Assessment/Calculations:             Physical Exam:   VS:  There were no vitals taken for this visit.   Wt Readings from Last 3 Encounters:  07/29/23 125 lb (56.7 kg)  07/13/23 127 lb 3.2 oz (57.7 kg)    GEN: Well nourished, well developed in no acute distress NECK: No JVD; No  carotid bruits CARDIAC: RRR, no murmurs, rubs, gallops RESPIRATORY:  Clear to auscultation without rales, wheezing or rhonchi  ABDOMEN: Soft, non-tender, non-distended EXTREMITIES:  No edema; No deformity  SKIN: sternotomy site healing  well, open slightly at the top of incision but not draining.   ASSESSMENT AND PLAN: .   Coronary artery disease-s/p CABG x 3, Stable with no anginal symptoms. No indication for ischemic evaluation.  Will have phone follow-up with Dr. Shyrl this Friday, she plans to discuss with him when she can return to work.  Continue aspirin  81 mg daily, continue Plavix  75 mg daily, continue metoprolol  25 mg daily, continue Crestor  20 mg daily. Ischemic cardiomyopathy/HFrEF -most recent echo revealed a EF of 35 to 40%, NYHA class I, euvolemic.  Continue Jardiance  10 mg daily, continue Cozaar  25 mg daily, continue metoprolol  25 mg daily, continue spironolactone  12.5 mg daily.  Creatinine on 07/12/2023 0.68, potassium 3.9.  Will plan repeat echo in 3 months now that she is on all GDMT. Hypertension-blood pressure today is 100/68, continue Cozaar  25 mg daily. Hyperlipidemia-recent LDL was controlled at 60 on 07/10/2023, continue Crestor  20 mg daily. Tobacco use-she is cut back considerably however is struggling with complete cessation.  I did offer her pharmacological help with stopping however she feels certain she can stop smoking on her own.       Dispo: Follow-up in 3 months.  Repeat FLP and LFTs in 6 weeks.  Likely need to arrange repeat echocardiogram at her next OV.  Signed, Delon JAYSON Hoover, NP  "

## 2023-07-31 ENCOUNTER — Ambulatory Visit: Payer: Managed Care, Other (non HMO) | Attending: Cardiology | Admitting: Cardiology

## 2023-07-31 ENCOUNTER — Encounter: Payer: Self-pay | Admitting: Cardiology

## 2023-07-31 VITALS — BP 100/68 | HR 102 | Ht 65.0 in | Wt 122.6 lb

## 2023-07-31 DIAGNOSIS — I10 Essential (primary) hypertension: Secondary | ICD-10-CM

## 2023-07-31 DIAGNOSIS — I2510 Atherosclerotic heart disease of native coronary artery without angina pectoris: Secondary | ICD-10-CM

## 2023-07-31 DIAGNOSIS — Z951 Presence of aortocoronary bypass graft: Secondary | ICD-10-CM

## 2023-07-31 DIAGNOSIS — I5020 Unspecified systolic (congestive) heart failure: Secondary | ICD-10-CM

## 2023-07-31 DIAGNOSIS — E785 Hyperlipidemia, unspecified: Secondary | ICD-10-CM

## 2023-07-31 NOTE — Patient Instructions (Signed)
Medication Instructions:  Your physician recommends that you continue on your current medications as directed. Please refer to the Current Medication list given to you today.  *If you need a refill on your cardiac medications before your next appointment, please call your pharmacy*   Lab Work: Your physician recommends that you return for lab work in:   Labs in 6 weeks: LFT, Lipids  If you have labs (blood work) drawn today and your tests are completely normal, you will receive your results only by: MyChart Message (if you have MyChart) OR A paper copy in the mail If you have any lab test that is abnormal or we need to change your treatment, we will call you to review the results.   Testing/Procedures: None   Follow-Up: At [facility], you and your health needs are our priority.  As part of our continuing mission to provide you with exceptional heart care, we have created designated Provider Care Teams.  These Care Teams include your primary Cardiologist (physician) and Advanced Practice Providers (APPs -  Physician Assistants and Nurse Practitioners) who all work together to provide you with the care you need, when you need it.  We recommend signing up for the patient portal called "MyChart".  Sign up information is provided on this After Visit Summary.  MyChart is used to connect with patients for Virtual Visits (Telemedicine).  Patients are able to view lab/test results, encounter notes, upcoming appointments, etc.  Non-urgent messages can be sent to your provider as well.   To learn more about what you can do with MyChart, go to [email or web address].    Your next appointment:   3 month(s)  Provider:   Dr. Vincent Gros   Other Instructions None

## 2023-08-01 ENCOUNTER — Encounter (HOSPITAL_COMMUNITY): Payer: Self-pay | Admitting: Thoracic Surgery (Cardiothoracic Vascular Surgery)

## 2023-08-01 NOTE — Progress Notes (Signed)
     [address]       [name] [address]             [phone]       Patient: Home Provider: Office Consent for Telemedicine visit obtained.  Today's visit was completed via a real-time telehealth (see specific modality noted below). The patient/authorized person provided oral consent at the time of the visit to engage in a telemedicine encounter with the present provider at [facility]. The patient/authorized person was informed of the potential benefits, limitations, and risks of telemedicine. The patient/authorized person expressed understanding that the laws that protect confidentiality also apply to telemedicine. The patient/authorized person acknowledged understanding that telemedicine does not provide emergency services and that he or she would need to call 911 or proceed to the nearest hospital for help if such a need arose.   Total time spent in the clinical discussion 10 minutes.  Telehealth Modality: Phone visit (audio only)  I had a telephone visit with  Joanne Garner who is s/p CABG.  Overall doing well.  She is having a lot of pain.  Ambulating well. Vitals have been stable.  Devi Kujawa will see [place] back in 1 month with a chest x-ray for cardiac rehab clearance.  Her pain meds have been refilled.    Shawnte Winton Keane Scrape

## 2023-08-01 NOTE — Addendum Note (Signed)
Addended by: Dickie La on: 08/01/2023 11:10 AM   Modules accepted: Level of Service

## 2023-08-01 NOTE — Progress Notes (Signed)
Internal Medicine Clinic Attending  I saw and evaluated the patient.  I personally confirmed the key portions of the history and exam documented by Dr. Annie Paras and I reviewed pertinent patient test results.  The assessment, diagnosis, and plan were formulated together and I agree with the documentation in the resident's note.   Joanne Garner reports she has established with a PCP at [facility] in [place] since hospital discharge and has already attended her first visit there. She wishes to continue her PC care services there and thought the appointment today was with the cardiologist. We clarified her appointment schedule and encourage continued f/u with her PCP and cardiology.

## 2023-08-01 NOTE — Progress Notes (Signed)
CBC unremarkable in regards to anemia. Spoke with patient on phone about results.

## 2023-08-02 ENCOUNTER — Ambulatory Visit (INDEPENDENT_AMBULATORY_CARE_PROVIDER_SITE_OTHER): Payer: Self-pay | Admitting: Thoracic Surgery (Cardiothoracic Vascular Surgery)

## 2023-08-02 DIAGNOSIS — Z951 Presence of aortocoronary bypass graft: Secondary | ICD-10-CM

## 2023-08-02 MED ORDER — OXYCODONE HCL 5 MG PO TABS: 10.0000 mg | ORAL_TABLET | Freq: Four times a day (QID) | ORAL | 0 refills | Status: DC | PRN

## 2023-08-19 ENCOUNTER — Telehealth: Payer: Self-pay | Admitting: *Deleted

## 2023-08-19 ENCOUNTER — Other Ambulatory Visit: Payer: Self-pay | Admitting: Physician Assistant

## 2023-08-19 NOTE — Telephone Encounter (Signed)
Patient contacted the office requesting a refill of oxycodone. Per patient she has been taking as needed for pain. States pain is in her chest along her incision. Describes pain as a tightness. States Oxycodone is the only thing that relieves pain. States Tylenol and Tramadol have not helped with pain in the past. Per E. Barrett, PA, advised patient that she should be taking Tylenol at this point for pain control. Advised that 1 month appt can be moved up for examination. Patient states she will contact us back to move up appt.

## 2023-08-28 NOTE — Progress Notes (Signed)
      [address]       [place] [address]             [phone]       HPI: Patient returns for routine postoperative follow-up having undergone CABG x 3 bu Dr. Cliffton Asters on 7/16. The patient's early postoperative recovery while in the hospital progressed without complication. Since hospital discharge the patient reports overall she is doing fairly well.  She is due to start cardiac rehab tomorrow.  She continues to have sharp "jolts" of pain at night which has been making sleep difficult.  She is still using pain medication which is not really helping, but used to allow her to sleep.  She is ambulating without difficulty.  Her incisions have healed and her sternotomy is mildly elevated.  Current Outpatient Medications  Medication Sig Dispense Refill   acetaminophen (TYLENOL) 500 MG tablet Take 1 tablet (500 mg total) by mouth every 6 (six) hours as needed.     aspirin EC 81 MG tablet Take 1 tablet (81 mg total) by mouth daily. Swallow whole. 90 tablet 3   clopidogrel (PLAVIX) 75 MG tablet Take 1 tablet (75 mg total) by mouth daily. 30 tablet 11   empagliflozin (JARDIANCE) 10 MG TABS tablet Take 1 tablet (10 mg total) by mouth daily. 30 tablet 5   losartan (COZAAR) 25 MG tablet Take 1 tablet (25 mg total) by mouth daily. 30 tablet 5   metoprolol succinate (TOPROL-XL) 25 MG 24 hr tablet Take 1 tablet (25 mg total) by mouth daily. 30 tablet 5   oxyCODONE (ROXICODONE) 5 MG immediate release tablet Take 2 tablets (10 mg total) by mouth every 6 (six) hours as needed. 30 tablet 0   rosuvastatin (CRESTOR) 20 MG tablet Take 1 tablet (20 mg total) by mouth daily. 30 tablet 5   spironolactone (ALDACTONE) 25 MG tablet Take 0.5 tablets (12.5 mg total) by mouth daily. 30 tablet 5   No current facility-administered medications for this visit.    Physical Exam:  BP 125/80 (BP Location: Right Arm, Patient Position: Sitting)   Pulse 93   Resp 18   Ht 5\' 5"  (1.651 m)   Wt 127 lb  (57.6 kg)   SpO2 98% Comment: RA  BMI 21.13 kg/m   Gen: NAD Heart: RRR Lungs: CTA bilaterally Ext: no edema Incisions: well healed, mild keloid along sternotomy  Diagnostic Tests:  CXR: no pleural effusions, no pneumothorax  A/P:  S/P CABG x 3-overall doing well, looks great HFrEF- GDMT per Cards HLD Sternal pain- what she describes sounds Neuropathic in nature.. I will give her a course of Gabapentin for this... she will not be provided any further narcotics Difficulty sleeping- encouraged to try Melatonin Cardiac Rehab- will be starting tomorrow Activity- okay to resume driving once not taking narcotic pain medications, sternal restrictions discussed RTC prn  Lowella Dandy, PA-C [facility] [phone]

## 2023-08-28 NOTE — Patient Instructions (Signed)
You are encouraged to enroll and participate in the outpatient cardiac rehab program beginning as soon as practical.  You may return to driving an automobile as long as you are no longer requiring oral narcotic pain relievers during the daytime.  It would be wise to start driving only short distances during the daylight and gradually increase from there as you feel comfortable.  Make every effort to maintain a "heart-healthy" lifestyle with regular physical exercise and adherence to a low-fat, low-carbohydrate diet.  Continue to seek regular follow-up appointments with your primary care physician and/or cardiologist.

## 2023-09-02 ENCOUNTER — Other Ambulatory Visit: Payer: Self-pay | Admitting: Thoracic Surgery (Cardiothoracic Vascular Surgery)

## 2023-09-02 DIAGNOSIS — Z951 Presence of aortocoronary bypass graft: Secondary | ICD-10-CM

## 2023-09-03 ENCOUNTER — Ambulatory Visit (INDEPENDENT_AMBULATORY_CARE_PROVIDER_SITE_OTHER): Payer: Self-pay | Admitting: Physician Assistant

## 2023-09-03 ENCOUNTER — Ambulatory Visit
Admission: RE | Admit: 2023-09-03 | Discharge: 2023-09-03 | Disposition: A | Discharge status: Home / Self Care | Payer: Managed Care, Other (non HMO) | Source: Ambulatory Visit | Attending: Thoracic Surgery (Cardiothoracic Vascular Surgery) | Admitting: Thoracic Surgery (Cardiothoracic Vascular Surgery)

## 2023-09-03 VITALS — BP 125/80 | HR 93 | Resp 18 | Ht 65.0 in | Wt 127.0 lb

## 2023-09-03 DIAGNOSIS — Z951 Presence of aortocoronary bypass graft: Secondary | ICD-10-CM

## 2023-09-03 MED ORDER — GABAPENTIN 300 MG PO CAPS: 300.0000 mg | ORAL_CAPSULE | Freq: Two times a day (BID) | ORAL | 0 refills | Status: DC

## 2023-09-26 NOTE — Progress Notes (Unsigned)
error 

## 2023-09-27 ENCOUNTER — Other Ambulatory Visit: Payer: Self-pay | Admitting: Physician Assistant

## 2023-09-27 ENCOUNTER — Other Ambulatory Visit: Payer: Self-pay | Admitting: *Deleted

## 2023-09-27 ENCOUNTER — Encounter: Payer: Self-pay | Admitting: *Deleted

## 2023-09-27 MED ORDER — GABAPENTIN 300 MG PO CAPS: 300.0000 mg | ORAL_CAPSULE | Freq: Two times a day (BID) | ORAL | 0 refills | Status: DC

## 2023-09-27 NOTE — Progress Notes (Signed)
Patient contacted requesting a refill of Gabapentin. Per Jaclyn Prime, PA, refill sent to preferred pharmacy. Patient aware all further refills will need to come from PCP. Patient verbalized understanding.

## 2023-09-30 ENCOUNTER — Other Ambulatory Visit: Payer: Self-pay | Admitting: *Deleted

## 2023-09-30 MED ORDER — GABAPENTIN 300 MG PO CAPS: 300.0000 mg | ORAL_CAPSULE | Freq: Two times a day (BID) | ORAL | 2 refills | Status: AC

## 2023-09-30 NOTE — Progress Notes (Signed)
Per patient, insurance will only cover 90 day RX of gabapentin. Three month supply sent per PA E. Barrett.

## 2023-10-25 LAB — COLOGUARD: COLOGUARD: NEGATIVE

## 2023-10-25 LAB — EXTERNAL GENERIC LAB PROCEDURE: COLOGUARD: NEGATIVE

## 2023-10-29 DIAGNOSIS — Z720 Tobacco use: Secondary | ICD-10-CM | POA: Insufficient documentation

## 2023-10-29 DIAGNOSIS — I10 Essential (primary) hypertension: Secondary | ICD-10-CM | POA: Insufficient documentation

## 2023-10-29 DIAGNOSIS — I2510 Atherosclerotic heart disease of native coronary artery without angina pectoris: Secondary | ICD-10-CM | POA: Insufficient documentation

## 2023-10-31 ENCOUNTER — Ambulatory Visit: Payer: Managed Care, Other (non HMO)

## 2023-10-31 VITALS — BP 132/80 | HR 98 | Ht 64.0 in | Wt 131.4 lb

## 2023-10-31 DIAGNOSIS — E785 Hyperlipidemia, unspecified: Secondary | ICD-10-CM | POA: Diagnosis not present

## 2023-10-31 DIAGNOSIS — I5020 Unspecified systolic (congestive) heart failure: Secondary | ICD-10-CM | POA: Diagnosis not present

## 2023-10-31 DIAGNOSIS — I2510 Atherosclerotic heart disease of native coronary artery without angina pectoris: Secondary | ICD-10-CM

## 2023-10-31 DIAGNOSIS — Z720 Tobacco use: Secondary | ICD-10-CM | POA: Diagnosis not present

## 2023-10-31 DIAGNOSIS — Z789 Other specified health status: Secondary | ICD-10-CM | POA: Insufficient documentation

## 2023-10-31 HISTORY — DX: Other specified health status: Z78.9

## 2023-10-31 HISTORY — DX: Hyperlipidemia, unspecified: E78.5

## 2023-10-31 NOTE — Assessment & Plan Note (Signed)
Last lipid panel 10/07/2023 total cholesterol 171, HDL 64, LDL 60, triglycerides 152. Continue current regimen of statin with rosuvastatin 20 mg once daily

## 2023-10-31 NOTE — Assessment & Plan Note (Signed)
Reviewed harmful effects with tobacco smoking. She is aware and she is working on quitting. Currently she does not smoke indoors at house. Reemphasized the need to quit completely.  Nicotine patches or gums as options available.

## 2023-10-31 NOTE — Addendum Note (Signed)
Addended by: Roxanne Mins I on: 10/31/2023 11:37 AM   Modules accepted: Orders

## 2023-10-31 NOTE — Assessment & Plan Note (Signed)
Drinking alcohol regularly. Discussed potential harmful effects in the setting of cardiomyopathy. Recommended to avoid drinking most days of the week, if and when she consumes not to consume more than 1 drink.

## 2023-10-31 NOTE — Assessment & Plan Note (Addendum)
Doing well good functional capacity NYHA class I Euvolemic, compensated  Good with dietary salt restriction. Currently not on any loop diuretic.  Guideline directed medical therapy currently on, Toprol-XL 25 mg once daily Jardiance 10 mg once daily Losartan 25 mg once daily Spironolactone 12.5 mg once daily   Repeat echocardiogram transthoracic being ordered Will review repeat echocardiogram results.  If EF recovered, will continue with current regimen. If EF is still low we will escalate therapy by transitioning from losartan to [facility].

## 2023-10-31 NOTE — Patient Instructions (Signed)
Medication Instructions:  Your physician recommends that you continue on your current medications as directed. Please refer to the Current Medication list given to you today.  *If you need a refill on your cardiac medications before your next appointment, please call your pharmacy*   Lab Work: None If you have labs (blood work) drawn today and your tests are completely normal, you will receive your results only by: MyChart Message (if you have MyChart) OR A paper copy in the mail If you have any lab test that is abnormal or we need to change your treatment, we will call you to review the results.   Testing/Procedures: Your physician has requested that you have an echocardiogram. Echocardiography is a painless test that uses sound waves to create images of your heart. It provides your doctor with information about the size and shape of your heart and how well your heart's chambers and valves are working. This procedure takes approximately one hour. There are no restrictions for this procedure. Please do NOT wear cologne, perfume, aftershave, or lotions (deodorant is allowed). Please arrive 15 minutes prior to your appointment time.  Please note: We ask at that you not bring children with you during ultrasound (echo/ vascular) testing. Due to room size and safety concerns, children are not allowed in the ultrasound rooms during exams. Our front office staff cannot provide observation of children in our lobby area while testing is being conducted. An adult accompanying a patient to their appointment will only be allowed in the ultrasound room at the discretion of the ultrasound technician under special circumstances. We apologize for any inconvenience.    Follow-Up: At [facility] you and your health needs are our priority.  As part of our continuing mission to provide you with exceptional heart care, we have created designated Provider Care Teams.  These Care Teams include your  primary Cardiologist (physician) and Advanced Practice Providers (APPs -  Physician Assistants and Nurse Practitioners) who all work together to provide you with the care you need, when you need it.  We recommend signing up for the patient portal called "MyChart".  Sign up information is provided on this After Visit Summary.  MyChart is used to connect with patients for Virtual Visits (Telemedicine).  Patients are able to view lab/test results, encounter notes, upcoming appointments, etc.  Non-urgent messages can be sent to your provider as well.   To learn more about what you can do with MyChart, go to [email or web address].    Your next appointment:   6 month(s)  Provider:   Huntley Dec, MD    Other Instructions Check blood pressures daily for 1 week. If the results are 130/80 or greater contact Dr. Vincent Gros.

## 2023-10-31 NOTE — Assessment & Plan Note (Signed)
Doing well, CCS class I. Remains asymptomatic.  Continue with aspirin 81 mg once daily indefinitely. Continue with Plavix 75 mg once daily for 12 months. Tolerating well no issues at this time with both these medications.  Continue statin therapy with rosuvastatin as reviewed below under dyslipidemia.

## 2023-10-31 NOTE — Progress Notes (Signed)
Cardiology Consultation:    Date:  10/31/2023   ID:  Joanne Garner, DOB September 19, 1966, MRN [id number]  PCP:  Joanne Murphy, MD  Cardiologist:  Joanne Murphy, MD   Referring MD: Joanne Miller, DO   No chief complaint on file.    ASSESSMENT AND PLAN:   Joanne Garner 57 year old woman with history of CAD s/p CABG in the setting of NSTEMI July 09, 2023, CHF with reduced LVEF 35 to 40%, hypertension, anxiety, tobacco use, alcohol consumption, here for a follow-up visit.  Problem List Items Addressed This Visit     HFrEF (heart failure with reduced ejection fraction), ischemic cardiomyopathy, LVEF 35 to 40% by TTE July 2024;    Doing well good functional capacity NYHA class I Euvolemic, compensated  Good with dietary salt restriction. Currently not on any loop diuretic.  Guideline directed medical therapy currently on, Toprol-XL 25 mg once daily Jardiance 10 mg once daily Losartan 25 mg once daily Spironolactone 12.5 mg once daily   Repeat echocardiogram transthoracic being ordered Will review repeat echocardiogram results.  If EF recovered, will continue with current regimen. If EF is still low we will escalate therapy by transitioning from losartan to [facility].       Coronary artery disease, s/p NSTEMI July 2024 s/p CABG [LIMA-LAD, SVG-diagonal, SVG-ramus] 07/09/2023 - Primary    Doing well, CCS class I. Remains asymptomatic.  Continue with aspirin 81 mg once daily indefinitely. Continue with Plavix 75 mg once daily for 12 months. Tolerating well no issues at this time with both these medications.  Continue statin therapy with rosuvastatin as reviewed below under dyslipidemia.       Relevant Orders   EKG 12-Lead (Completed)   ECHOCARDIOGRAM COMPLETE   Comp Met (CMET)   Tobacco use    Reviewed harmful effects with tobacco smoking. She is aware and she is working on quitting. Currently she does not smoke indoors at house. Reemphasized the need to  quit completely.  Nicotine patches or gums as options available.      Dyslipidemia    Last lipid panel 10/07/2023 total cholesterol 171, HDL 64, LDL 60, triglycerides 152. Continue current regimen of statin with rosuvastatin 20 mg once daily       Alcohol consumption four to six days per week    Drinking alcohol regularly. Discussed potential harmful effects in the setting of cardiomyopathy. Recommended to avoid drinking most days of the week, if and when she consumes not to consume more than 1 drink.      Will review CMP results and echocardiogram results once available. Return to clinic for follow-up in 6 months   History of Present Illness:    Joanne Garner is a 57 y.o. female who is being seen today for follow-up visit.  Last office visit with [place] was with Wallis Bamberg, FNP-C on 07/31/2023. PCP is Joanne Miller, DO.  Has history of CAD p/w NSTEMI s/p CABG (LIMA-LAD, SVG-diagonal, SVG-ramus) 07/09/2023, chronic heart failure with reduced ejection fraction LVEF 35-40% by TTE 07/06/2023, hypertension, anxiety, tobacco use.  Pleasant woman here for the visit by herself works as a Lawyer at [employer].  Lives with her husband and home who currently is being evaluated and managed for multiple myeloma.  Mentions she has been back to work and was until this week working third shift but from tomorrow going to start her day shifts and she is excited about this.  She is walking regularly at the [employer] indoors for  for an extended time without any discomfort.  Mentions wound is healed up well at the sternotomy site at times during sleeping hours she might feel tingling sensation or sharp sensation with movement.  Continues to smoke, working on cutting down. Continues to drink alcohol 1-2 beers a day.  EKG in the clinic today shows heart rate 86 sinus rhythm, normal PR interval 186 ms, QRS duration 140 ms with LVH criteria, anteroseptal Q waves suggestive of prior  infarct. Prior EKG for comparison is from July that showed sinus rhythm with septal infarct.  Good compliance with all her medications at home. No recent metabolic panel available to review.    Past Medical History:  Diagnosis Date   Anemia 07/29/2023   Coronary artery disease    06/2023   HFrEF (heart failure with reduced ejection fraction) (HCC)    Hx of CABG 07/09/2023   Hypertension    NSTEMI (non-ST elevated myocardial infarction) (HCC) 07/05/2023   Prediabetes    S/P CABG x 3 07/09/2023   LIMA to LAD, SVG to Diagonal, SVG to Ramus     Tobacco use     Past Surgical History:  Procedure Laterality Date   ABDOMINAL HYSTERECTOMY     CORONARY ARTERY BYPASS GRAFT N/A 07/09/2023   Procedure: CORONARY ARTERY BYPASS GRAFTING (CABG) x 3 USING LEFT INTERNAL MAMMARY ARTERY AND ENDOSCOPICALY HARVESTED RIGHT GREATER SAPHENOUS VEIN;  Surgeon: [name], MD;  Location: [facility] OR;  Service: Open Heart Surgery;  Laterality: N/A;   LEFT HEART CATH AND CORONARY ANGIOGRAPHY N/A 07/05/2023   Procedure: LEFT HEART CATH AND CORONARY ANGIOGRAPHY;  Surgeon: [place]az[place], Peter M, MD;  [facility] INVASIVE CV LAB;  Service: Cardiovascular;  Laterality: N/A;   TEE WITHOUT CARDIOVERSION N/A 07/09/2023   Procedure: TRANSESOPHAGEAL ECHOCARDIOGRAM;  Surgeon: [name], MD;  Location: [facility] OR;  Service: Open Heart Surgery;  Laterality: N/A;    Current Medications: Current Meds  Medication Sig   acetaminophen (TYLENOL) 500 MG tablet Take 1 tablet (500 mg total) by mouth every 6 (six) hours as needed.   aspirin EC 81 MG tablet Take 1 tablet (81 mg total) by mouth daily. Swallow whole.   clopidogrel (PLAVIX) 75 MG tablet Take 1 tablet (75 mg total) by mouth daily.   empagliflozin (JARDIANCE) 10 MG TABS tablet Take 1 tablet (10 mg total) by mouth daily.   gabapentin (NEURONTIN) 300 MG capsule Take 1 capsule (300 mg total) by mouth 2 (two) times daily.   losartan (COZAAR) 25 MG tablet Take 1 tablet  (25 mg total) by mouth daily.   metoprolol succinate (TOPROL-XL) 25 MG 24 hr tablet Take 1 tablet (25 mg total) by mouth daily.   rosuvastatin (CRESTOR) 20 MG tablet Take 1 tablet (20 mg total) by mouth daily.   spironolactone (ALDACTONE) 25 MG tablet Take 0.5 tablets (12.5 mg total) by mouth daily.     Allergies:   No known allergies   Social History   Socioeconomic History   Marital status: Married    Spouse name: Not on file   Number of children: Not on file   Years of education: Not on file   Highest education level: Not on file  Occupational History   Not on file  Tobacco Use   Smoking status: Every Day    Current packs/day: 0.50    Average packs/day: 0.5 packs/day for 41.8 years (20.9 ttl pk-yrs)    Types: Cigarettes    Start date: 46   Smokeless tobacco: Not on  file  Vaping Use   Vaping status: Never Used  Substance and Sexual Activity   Alcohol use: Not Currently   Drug use: Not Currently   Sexual activity: Not Currently  Other Topics Concern   Not on file  Social History Narrative   Not on file   Social Determinants of Health   Financial Resource Strain: Not on file  Food Insecurity: Food Insecurity Present (07/08/2023)   Hunger Vital Sign    Worried About Running Out of Food in the Last Year: Sometimes true    Ran Out of Food in the Last Year: Often true  Transportation Needs: Unmet Transportation Needs (07/08/2023)   PRAPARE - Administrator, Civil Service (Medical): Yes    Lack of Transportation (Non-Medical): Yes  Physical Activity: Not on file  Stress: Not on file  Social Connections: Not on file     Family History: The patient's family history includes Leukemia in her sister; Prostate cancer in her father. ROS:   Please see the history of present illness.    All 14 point review of systems negative except as described per history of present illness.  EKGs/Labs/Other Studies Reviewed:    The following studies were reviewed  today:   EKG:  EKG Interpretation Date/Time:  Thursday October 31 2023 11:00:47 EST Ventricular Rate:  86 PR Interval:  186 QRS Duration:  114 QT Interval:  326 QTC Calculation: 390 R Axis:   -28  Text Interpretation: Normal sinus rhythm Possible Left atrial enlargement Minimal voltage criteria for LVH, may be normal variant ( Cornell product ) Septal infarct (cited on or before 06-Jul-2023) When compared with ECG of 10-Jul-2023 06:44, QRS axis Shifted left ST no longer elevated in Lateral leads Nonspecific T wave abnormality, worse in Inferior leads Nonspecific T wave abnormality, worse in Anterolateral leads Confirmed by Huntley Dec reddy [phone]) on 10/31/2023 11:03:55 AM    Recent Labs: 07/10/2023: Magnesium 2.0 07/11/2023: BUN 7; Creatinine, Ser 0.68; Potassium 3.9; Sodium 135 07/29/2023: Hemoglobin 11.8; Platelets 393  Recent Lipid Panel    Component Value Date/Time   CHOL 121 07/10/2023 0421   TRIG 58 07/10/2023 0421   HDL 49 07/10/2023 0421   CHOLHDL 2.5 07/10/2023 0421   VLDL 12 07/10/2023 0421   LDLCALC 60 07/10/2023 0421    Physical Exam:    VS:  BP 132/80   Pulse 98   Ht 5\' 4"  (1.626 m)   Wt 131 lb 6.4 oz (59.6 kg)   SpO2 96%   BMI 22.55 kg/m     Wt Readings from Last 3 Encounters:  10/31/23 131 lb 6.4 oz (59.6 kg)  09/03/23 127 lb (57.6 kg)  07/31/23 122 lb 9.6 oz (55.6 kg)     GENERAL:  Well nourished, well developed in no acute distress NECK: No JVD; No carotid bruits CARDIAC: RRR, S1 and S2 present, no murmurs, no rubs, no gallops. Sternotomy scar observed, appears well-healed. CHEST:  Clear to auscultation without rales, wheezing or rhonchi  Extremities: No pitting pedal edema. Pulses bilaterally symmetric with radial 2+ and dorsalis pedis 2+ NEUROLOGIC:  Alert and oriented x 3  Medication Adjustments/Labs and Tests Ordered: Current medicines are reviewed at length with the patient today.  Concerns regarding medicines are outlined above.   Orders Placed This Encounter  Procedures   Comp Met (CMET)   EKG 12-Lead   ECHOCARDIOGRAM COMPLETE   No orders of the defined types were placed in this encounter.   Signed, Vern Claude reddy Zilah Villaflor, MD,  MPH, FACC. 10/31/2023 11:13 AM    [facility]

## 2023-11-05 ENCOUNTER — Telehealth: Payer: Self-pay

## 2023-11-05 NOTE — Telephone Encounter (Signed)
Pt arrived to the office for labs and states that she had taken her medications 11/04/23 at 0730 and then again at 1930 (7:30 pm) as she thought it was the next day. Pt realized she took it to early and is requesting advise on when to take next dose. Please advise

## 2023-11-05 NOTE — Telephone Encounter (Signed)
Recommend continuing Plavix tomorrow as originally scheduled. Please let patient know she may be more prone to bleeding/bruising today

## 2023-11-05 NOTE — Telephone Encounter (Signed)
plavix

## 2023-11-05 NOTE — Telephone Encounter (Signed)
Left a detailed message for pt as recommended by the pharmacist.

## 2023-11-06 LAB — COMPREHENSIVE METABOLIC PANEL
ALT: 20 [IU]/L (ref 0–32)
AST: 21 [IU]/L (ref 0–40)
Albumin: 4.5 g/dL (ref 3.8–4.9)
Alkaline Phosphatase: 101 [IU]/L (ref 44–121)
BUN/Creatinine Ratio: 28 — ABNORMAL HIGH (ref 9–23)
BUN: 19 mg/dL (ref 6–24)
Bilirubin Total: <0.2 mg/dL (ref 0.0–1.2)
CO2: 21 mmol/L (ref 20–29)
Calcium: 9.8 mg/dL (ref 8.7–10.2)
Chloride: 102 mmol/L (ref 96–106)
Creatinine, Ser: 0.67 mg/dL (ref 0.57–1.00)
Globulin, Total: 2.6 g/dL (ref 1.5–4.5)
Glucose: 101 mg/dL — ABNORMAL HIGH (ref 70–99)
Potassium: 4.4 mmol/L (ref 3.5–5.2)
Sodium: 136 mmol/L (ref 134–144)
Total Protein: 7.1 g/dL (ref 6.0–8.5)
eGFR: 101 mL/min/{1.73_m2} (ref >59)

## 2023-11-06 LAB — LIPID PANEL
Chol/HDL Ratio: 2.7 ratio (ref 0.0–4.4)
Cholesterol, Total: 174 mg/dL (ref 100–199)
HDL: 64 mg/dL (ref >39)
LDL Chol Calc (NIH): 92 mg/dL (ref 0–99)
Triglycerides: 97 mg/dL (ref 0–149)
VLDL Cholesterol Cal: 18 mg/dL (ref 5–40)

## 2023-11-06 LAB — HEPATIC FUNCTION PANEL: Bilirubin, Direct: 0.08 mg/dL (ref 0.00–0.40)

## 2023-12-06 ENCOUNTER — Ambulatory Visit: Payer: Managed Care, Other (non HMO)

## 2023-12-06 DIAGNOSIS — I2510 Atherosclerotic heart disease of native coronary artery without angina pectoris: Secondary | ICD-10-CM | POA: Diagnosis not present

## 2023-12-07 LAB — ECHOCARDIOGRAM COMPLETE
Area-P 1/2: 6.27 cm2
S' Lateral: 2.2 cm

## 2023-12-24 ENCOUNTER — Other Ambulatory Visit: Payer: Self-pay | Admitting: Physician Assistant

## 2024-02-04 ENCOUNTER — Telehealth: Payer: Self-pay

## 2024-02-04 ENCOUNTER — Other Ambulatory Visit: Payer: Self-pay | Admitting: Physician Assistant

## 2024-02-04 MED ORDER — ROSUVASTATIN CALCIUM 40 MG PO TABS: 40.0000 mg | ORAL_TABLET | Freq: Every day | ORAL | 3 refills | Status: DC

## 2024-02-04 MED ORDER — ROSUVASTATIN CALCIUM 20 MG PO TABS: 20.0000 mg | ORAL_TABLET | Freq: Every day | ORAL | 10 refills | Status: DC

## 2024-02-04 NOTE — Telephone Encounter (Signed)
*  STAT* If patient is at the pharmacy, call can be transferred to refill team.   1. Which medications need to be refilled? (please list name of each medication and dose if known)   rosuvastatin (CRESTOR) 20 MG tablet   2. Would you like to learn more about the convenience, safety, & potential cost savings by using the [facility]?   3. Are you open to using the [facility] (Type [facility]. ).  4. Which pharmacy/location (including street and city if local pharmacy) is medication to be sent to?  [id number] - [place], [place] - [address]   5. Do they need a 30 day or 90 day supply?   30 day  Patient stated her pharmacy had not received the refill prescription and wants it re-sent.

## 2024-02-04 NOTE — Telephone Encounter (Signed)
Pt c/o medication issue:  1. Name of Medication: rosuvastatin (CRESTOR) 20 MG tablet   2. How are you currently taking this medication (dosage and times per day)? As prescribed   3. Are you having a reaction (difficulty breathing--STAT)? No   4. What is your medication issue? Patient is calling due to the prescription that was sent to the pharmacy having the same instructions she currently takes instead of 40 mg's daily as advised by MD. Patient has left her home phone for callback due to her cell not receiving the callbacks. Please advise.

## 2024-02-04 NOTE — Telephone Encounter (Signed)
Refill has been sent.

## 2024-04-06 ENCOUNTER — Encounter: Payer: Self-pay | Admitting: Internal Medicine

## 2024-04-06 ENCOUNTER — Telehealth: Payer: Self-pay

## 2024-04-06 NOTE — Telephone Encounter (Signed)
 1. Avoid all over-the-counter antihistamines except Claritin/Loratadine and Zyrtec/Cetrizine. 2. Avoid all combination including cold sinus allergies flu decongestant and sleep medications 3. You can use Robitussin DM Mucinex and Mucinex DM for cough. 4. can use Tylenol aspirin ibuprofen and naproxen but no combinations such as sleep or sinus.   Spoke with pt and discussed above. Pt verbalized understanding and had no additional questions.

## 2024-04-06 NOTE — Telephone Encounter (Signed)
 Patient has been having chest congestion and allergy symptoms due to the pollen. She says she's fine when she's sitting up or walking around, but when she lays down she has a persistent cough. She would like to know if she can take an over the counter allergy medication, and if so is there one Dr. Ronell Coe would recommend for her?

## 2024-04-06 NOTE — Telephone Encounter (Signed)
 Error

## 2024-04-28 ENCOUNTER — Ambulatory Visit

## 2024-04-28 VITALS — BP 134/72 | HR 90 | Ht 64.0 in | Wt 136.0 lb

## 2024-04-28 DIAGNOSIS — I2510 Atherosclerotic heart disease of native coronary artery without angina pectoris: Secondary | ICD-10-CM | POA: Diagnosis not present

## 2024-04-28 DIAGNOSIS — I5020 Unspecified systolic (congestive) heart failure: Secondary | ICD-10-CM | POA: Diagnosis not present

## 2024-04-28 DIAGNOSIS — I10 Essential (primary) hypertension: Secondary | ICD-10-CM | POA: Diagnosis not present

## 2024-04-28 DIAGNOSIS — E785 Hyperlipidemia, unspecified: Secondary | ICD-10-CM | POA: Diagnosis not present

## 2024-04-28 DIAGNOSIS — Z789 Other specified health status: Secondary | ICD-10-CM

## 2024-04-28 DIAGNOSIS — Z720 Tobacco use: Secondary | ICD-10-CM

## 2024-04-28 MED ORDER — ROSUVASTATIN CALCIUM 40 MG PO TABS: 40.0000 mg | ORAL_TABLET | Freq: Every day | ORAL | 3 refills | Status: AC

## 2024-04-28 NOTE — Progress Notes (Signed)
 Cardiology Consultation:    Date:  04/28/2024   ID:  Joanne Garner, Joanne Garner Jun 04, 1966, MRN [id number]  PCP:  Trudi Fus, NP  Cardiologist:  Daymon Evans Desirre Eickhoff, MD   Referring MD: Angelena Kells, *   No chief complaint on file.    ASSESSMENT AND PLAN:   Ms. Joanne Garner 58 year old woman with history of CAD s/p CABG in the setting of NSTEMI July 09, 2023, CHF with recovered [LVEF 35 to 40% July 2024; recovered with LVEF 55 to 60% 12/06/2023], hypertension, hyperlipidemia, anxiety, tobacco use, alcohol consumption.  Here for routine follow-up and doing well.  Problem List Items Addressed This Visit     HFrEF (heart failure with reduced ejection fraction), ischemic cardiomyopathy, LVEF 35 to 40% by TTE 06/2023; recovered to LVEF 55-60% by TTE 11/2023   NYHA class I. Euvolemic, compensated.  Continues with dietary salt restriction. Not on any loop diuretic.  Guideline directed medical therapy remains on Toprol -XL 25 mg once daily Jardiance  10 mg once daily Losartan  25 mg once daily Spironolactone  12.5 mg once daily.       Relevant Medications   rosuvastatin  (CRESTOR ) 40 MG tablet   Coronary artery disease, s/p NSTEMI July 2024 s/p CABG [LIMA-LAD, SVG-diagonal, SVG-ramus] 07/09/2023 - Primary   Doing well CCS class I. Asymptomatic.  Continue with dual antiplatelet therapy until end of July. Discussed long-term antiplatelet choice and continue with clopidogrel  75 mg once daily indefinitely.  Continue with rosuvastatin .      Relevant Medications   rosuvastatin  (CRESTOR ) 40 MG tablet   Hypertension   Blood pressure suboptimal today. Advised to check blood pressure at home and maintain a log if blood pressure persistently above 130/80 mmHg will titrate up the dose of losartan .       Relevant Medications   rosuvastatin  (CRESTOR ) 40 MG tablet   Tobacco use   Reviewed once again about harmful effects of smoking. Encouraged her to quit completely.       Dyslipidemia   Lipid panel from 11/05/2023 total cholesterol 174, triglycerides 97, HDL 64, LDL 92. Continue rosuvastatin  40 mg once daily. Will check repeat lipid panel at follow-up visit in 1 year if not done through PCP.Joanne Garner      Relevant Medications   rosuvastatin  (CRESTOR ) 40 MG tablet   Alcohol consumption two to four days per week   Advised her to cut down on alcohol consumption to no more than 1 drink on the day she does consume.  Recommended to abstain entirely if possible.      Return to clinic tentatively in 1 year   History of Present Illness:    Joanne Garner is a 58 y.o. female who is being seen today for follow up visit. Last visit in the office with me was 10/31/2023. PCP is Cherl Corner NP.  History of CAD s/p CABG in the setting of NSTEMI July 09, 2023, CHF with recovered [LVEF 35 to 40% July 2024; recovered with LVEF 55 to 60% 12/06/2023], hypertension, hyperlipidemia, anxiety, tobacco use, alcohol consumption.  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  Mentions overall she is doing well and able to keep up with her activities at home and her job without any difficulty. At times when her clothing is too tight she can feel discomfort on the chest wall which relieves with changing her clothing.  Has not been checking blood pressure regularly at home.  Mentions this morning  she was up and active and was also looking around for the last pendant which was a gift from her mother.  Good compliance with all her medications. Tells me that she is cut down on the smoking but still smokes intermittently once or twice a week. She consumes alcohol up to 3 times a week and about 1-2 beers.  Lipid panel from 11/05/2023 total cholesterol 174, triglycerides 97, HDL 64, LDL 92.  Past Medical History:  Diagnosis Date   Alcohol consumption four to six days per week 10/31/2023   Anemia 07/29/2023   Coronary  artery disease    06/2023   Dyslipidemia 10/31/2023   HFrEF (heart failure with reduced ejection fraction) (HCC)    Hx of CABG 07/09/2023   Hypertension    NSTEMI (non-ST elevated myocardial infarction) (HCC) 07/05/2023   Prediabetes    S/P CABG x 3 07/09/2023   LIMA to LAD, SVG to Diagonal, SVG to Ramus     Tobacco use     Past Surgical History:  Procedure Laterality Date   ABDOMINAL HYSTERECTOMY     CORONARY ARTERY BYPASS GRAFT N/A 07/09/2023   Procedure: CORONARY ARTERY BYPASS GRAFTING (CABG) x 3 USING LEFT INTERNAL MAMMARY ARTERY AND ENDOSCOPICALY HARVESTED RIGHT GREATER SAPHENOUS VEIN;  Surgeon: Hilarie Lovely, MD;  Location: [facility] OR;  Service: Open Heart Surgery;  Laterality: N/A;   LEFT HEART CATH AND CORONARY ANGIOGRAPHY N/A 07/05/2023   Procedure: LEFT HEART CATH AND CORONARY ANGIOGRAPHY;  Surgeon: Swaziland, Peter M, MD;  Location: [facility] INVASIVE CV LAB;  Service: Cardiovascular;  Laterality: N/A;   TEE WITHOUT CARDIOVERSION N/A 07/09/2023   Procedure: TRANSESOPHAGEAL ECHOCARDIOGRAM;  Surgeon: Hilarie Lovely, MD;  Location: [facility] OR;  Service: Open Heart Surgery;  Laterality: N/A;    Current Medications: Current Meds  Medication Sig   acetaminophen  (TYLENOL ) 500 MG tablet Take 1 tablet (500 mg total) by mouth every 6 (six) hours as needed.   aspirin  EC 81 MG tablet Take 1 tablet (81 mg total) by mouth daily. Swallow whole.   clopidogrel  (PLAVIX ) 75 MG tablet Take 1 tablet (75 mg total) by mouth daily.   empagliflozin  (JARDIANCE ) 10 MG TABS tablet Take 1 tablet (10 mg total) by mouth daily.   gabapentin  (NEURONTIN ) 300 MG capsule Take 1 capsule (300 mg total) by mouth 2 (two) times daily.   losartan  (COZAAR ) 25 MG tablet Take 1 tablet (25 mg total) by mouth daily.   metoprolol  succinate (TOPROL -XL) 25 MG 24 hr tablet Take 1 tablet (25 mg total) by mouth daily.   spironolactone  (ALDACTONE ) 25 MG tablet Take 0.5 tablets (12.5 mg total) by mouth daily.   [DISCONTINUED]  rosuvastatin  (CRESTOR ) 40 MG tablet Take 1 tablet (40 mg total) by mouth daily.     Allergies:   No known allergies   Social History   Socioeconomic History   Marital status: Married    Spouse name: Not on file   Number of children: Not on file   Years of education: Not on file   Highest education level: Not on file  Occupational History   Not on file  Tobacco Use   Smoking status: Every Day    Current packs/day: 0.50    Average packs/day: 0.5 packs/day for 42.3 years (21.2 ttl pk-yrs)    Types: Cigarettes    Start date: 15   Smokeless tobacco: Not on file  Vaping Use   Vaping status: Never Used  Substance and Sexual Activity   Alcohol use: Not Currently  Drug use: Not Currently   Sexual activity: Not Currently  Other Topics Concern   Not on file  Social History Narrative   Not on file   Social Drivers of Health   Financial Resource Strain: Not on file  Food Insecurity: Food Insecurity Present (07/08/2023)   Hunger Vital Sign    Worried About Running Out of Food in the Last Year: Sometimes true    Ran Out of Food in the Last Year: Often true  Transportation Needs: Unmet Transportation Needs (07/08/2023)   PRAPARE - Administrator, Civil Service (Medical): Yes    Lack of Transportation (Non-Medical): Yes  Physical Activity: Not on file  Stress: Not on file  Social Connections: Not on file     Family History: The patient's family history includes Leukemia in her sister; Prostate cancer in her father. ROS:   Please see the history of present illness.    All 14 point review of systems negative except as described per history of present illness.  EKGs/Labs/Other Studies Reviewed:    The following studies were reviewed today:   EKG:       Recent Labs: 07/10/2023: Magnesium  2.0 07/29/2023: Hemoglobin 11.8; Platelets 393 11/05/2023: ALT 20; BUN 19; Creatinine, Ser 0.67; Potassium 4.4; Sodium 136  Recent Lipid Panel    Component Value Date/Time    CHOL 174 11/05/2023 0815   TRIG 97 11/05/2023 0815   HDL 64 11/05/2023 0815   CHOLHDL 2.7 11/05/2023 0815   CHOLHDL 2.5 07/10/2023 0421   VLDL 12 07/10/2023 0421   LDLCALC 92 11/05/2023 0815    Physical Exam:    VS:  BP 134/72   Pulse 90   Ht 5\' 4"  (1.626 m)   Wt 136 lb (61.7 kg)   SpO2 97%   BMI 23.34 kg/m     Wt Readings from Last 3 Encounters:  04/28/24 136 lb (61.7 kg)  10/31/23 131 lb 6.4 oz (59.6 kg)  09/03/23 127 lb (57.6 kg)     GENERAL:  Well nourished, well developed in no acute distress NECK: No JVD; No carotid bruits CARDIAC: RRR, S1 and S2 present, no murmurs, no rubs, no gallops CHEST: Sternotomy scar well-healed.  Clear to auscultation without rales, wheezing or rhonchi  Extremities: No pitting pedal edema. Pulses bilaterally symmetric with radial 2+ and dorsalis pedis 2+ NEUROLOGIC:  Alert and oriented x 3  Medication Adjustments/Labs and Tests Ordered: Current medicines are reviewed at length with the patient today.  Concerns regarding medicines are outlined above.  No orders of the defined types were placed in this encounter.  Meds ordered this encounter  Medications   rosuvastatin  (CRESTOR ) 40 MG tablet    Sig: Take 1 tablet (40 mg total) by mouth daily.    Dispense:  90 tablet    Refill:  3    Signed, Zury Fazzino reddy Laddie Math, MD, MPH, [facility]. 04/28/2024 8:33 AM    [facility]

## 2024-04-28 NOTE — Assessment & Plan Note (Addendum)
 NYHA class I. Euvolemic, compensated.  Continues with dietary salt restriction. Not on any loop diuretic.  Guideline directed medical therapy remains on Toprol -XL 25 mg once daily Jardiance  10 mg once daily Losartan  25 mg once daily Spironolactone  12.5 mg once daily.

## 2024-04-28 NOTE — Assessment & Plan Note (Signed)
 Reviewed once again about harmful effects of smoking. Encouraged her to quit completely.

## 2024-04-28 NOTE — Patient Instructions (Signed)
 Medication Instructions:    Keep taking Aspirin  and Plavix  at the end of July.   At the end of July, you may stop Aspirin .    *If you need a refill on your cardiac medications before your next appointment, please call your pharmacy*   Lab Work: None Ordered If you have labs (blood work) drawn today and your tests are completely normal, you will receive your results only by: MyChart Message (if you have MyChart) OR A paper copy in the mail If you have any lab test that is abnormal or we need to change your treatment, we will call you to review the results.   Testing/Procedures: None Ordered   Follow-Up: At [facility], you and your health needs are our priority.  As part of our continuing mission to provide you with exceptional heart care, we have created designated Provider Care Teams.  These Care Teams include your primary Cardiologist (physician) and Advanced Practice Providers (APPs -  Physician Assistants and Nurse Practitioners) who all work together to provide you with the care you need, when you need it.  We recommend signing up for the patient portal called "MyChart".  Sign up information is provided on this After Visit Summary.  MyChart is used to connect with patients for Virtual Visits (Telemedicine).  Patients are able to view lab/test results, encounter notes, upcoming appointments, etc.  Non-urgent messages can be sent to your provider as well.   To learn more about what you can do with MyChart, go to [email or web address].    Your next appointment:   1 year follow up

## 2024-04-28 NOTE — Assessment & Plan Note (Signed)
 Lipid panel from 11/05/2023 total cholesterol 174, triglycerides 97, HDL 64, LDL 92. Continue rosuvastatin  40 mg once daily. Will check repeat lipid panel at follow-up visit in 1 year if not done through PCP.[name]

## 2024-04-28 NOTE — Assessment & Plan Note (Addendum)
 Advised her to cut down on alcohol consumption to no more than 1 drink on the day she does consume.  Recommended to abstain entirely if possible.

## 2024-04-28 NOTE — Assessment & Plan Note (Signed)
 Blood pressure suboptimal today. Advised to check blood pressure at home and maintain a log if blood pressure persistently above 130/80 mmHg will titrate up the dose of losartan .

## 2024-04-28 NOTE — Assessment & Plan Note (Signed)
 Doing well CCS class I. Asymptomatic.  Continue with dual antiplatelet therapy until end of July. Discussed long-term antiplatelet choice and continue with clopidogrel  75 mg once daily indefinitely.  Continue with rosuvastatin .
